# Patient Record
Sex: Male | Born: 1955 | Hispanic: Yes | Marital: Married | Smoking: Former smoker
Health system: Southern US, Community
[De-identification: ages and names within clinical notes are randomized; demographics above are authoritative.]

## PROBLEM LIST (undated history)

## (undated) DIAGNOSIS — E0590 Thyrotoxicosis, unspecified without thyrotoxic crisis or storm: Secondary | ICD-10-CM

## (undated) DIAGNOSIS — E785 Hyperlipidemia, unspecified: Secondary | ICD-10-CM

## (undated) DIAGNOSIS — D649 Anemia, unspecified: Secondary | ICD-10-CM

## (undated) DIAGNOSIS — T7840XA Allergy, unspecified, initial encounter: Secondary | ICD-10-CM

## (undated) DIAGNOSIS — R7611 Nonspecific reaction to tuberculin skin test without active tuberculosis: Secondary | ICD-10-CM

## (undated) DIAGNOSIS — C801 Malignant (primary) neoplasm, unspecified: Secondary | ICD-10-CM

## (undated) DIAGNOSIS — Z5189 Encounter for other specified aftercare: Secondary | ICD-10-CM

## (undated) DIAGNOSIS — J45909 Unspecified asthma, uncomplicated: Secondary | ICD-10-CM

## (undated) DIAGNOSIS — K219 Gastro-esophageal reflux disease without esophagitis: Secondary | ICD-10-CM

## (undated) DIAGNOSIS — J302 Other seasonal allergic rhinitis: Secondary | ICD-10-CM

## (undated) HISTORY — DX: Unspecified asthma, uncomplicated: J45.909

## (undated) HISTORY — PX: COLONOSCOPY W/ BIOPSIES AND POLYPECTOMY: SHX1376

## (undated) HISTORY — DX: Allergy, unspecified, initial encounter: T78.40XA

## (undated) HISTORY — PX: POLYPECTOMY: SHX149

## (undated) HISTORY — DX: Gastro-esophageal reflux disease without esophagitis: K21.9

## (undated) HISTORY — DX: Malignant (primary) neoplasm, unspecified: C80.1

## (undated) HISTORY — DX: Anemia, unspecified: D64.9

## (undated) HISTORY — DX: Hyperlipidemia, unspecified: E78.5

## (undated) HISTORY — DX: Encounter for other specified aftercare: Z51.89

## (undated) HISTORY — PX: COLONOSCOPY: SHX174

---
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
## 2008-03-04 HISTORY — PX: CHOLECYSTECTOMY: SHX55

## 2008-04-07 ENCOUNTER — Ambulatory Visit (HOSPITAL_BASED_OUTPATIENT_CLINIC_OR_DEPARTMENT_OTHER): Admission: RE | Admit: 2008-04-07 | Discharge: 2008-04-07 | Payer: Self-pay | Admitting: Family Medicine

## 2008-04-07 ENCOUNTER — Encounter (INDEPENDENT_AMBULATORY_CARE_PROVIDER_SITE_OTHER): Payer: Self-pay | Admitting: *Deleted

## 2008-04-07 ENCOUNTER — Ambulatory Visit: Payer: Self-pay | Admitting: Diagnostic Radiology

## 2008-05-04 ENCOUNTER — Encounter (INDEPENDENT_AMBULATORY_CARE_PROVIDER_SITE_OTHER): Payer: Self-pay | Admitting: *Deleted

## 2008-05-04 ENCOUNTER — Ambulatory Visit (HOSPITAL_COMMUNITY): Admission: RE | Admit: 2008-05-04 | Discharge: 2008-05-06 | Payer: Self-pay | Admitting: General Surgery

## 2008-05-04 ENCOUNTER — Encounter (INDEPENDENT_AMBULATORY_CARE_PROVIDER_SITE_OTHER): Payer: Self-pay | Admitting: General Surgery

## 2008-05-04 ENCOUNTER — Ambulatory Visit: Payer: Self-pay | Admitting: Internal Medicine

## 2008-05-05 ENCOUNTER — Encounter: Payer: Self-pay | Admitting: Internal Medicine

## 2008-05-09 ENCOUNTER — Ambulatory Visit: Payer: Self-pay | Admitting: Internal Medicine

## 2008-05-09 LAB — CONVERTED CEMR LAB
ALT: 74 units/L — ABNORMAL HIGH (ref 0–53)
Albumin: 3.9 g/dL (ref 3.5–5.2)
Bilirubin, Direct: 0.2 mg/dL (ref 0.0–0.3)
Total Protein: 7.4 g/dL (ref 6.0–8.3)

## 2008-05-10 ENCOUNTER — Telehealth: Payer: Self-pay | Admitting: Internal Medicine

## 2008-05-16 ENCOUNTER — Ambulatory Visit: Payer: Self-pay | Admitting: Internal Medicine

## 2008-05-16 DIAGNOSIS — Z862 Personal history of diseases of the blood and blood-forming organs and certain disorders involving the immune mechanism: Secondary | ICD-10-CM

## 2008-05-16 DIAGNOSIS — Z8639 Personal history of other endocrine, nutritional and metabolic disease: Secondary | ICD-10-CM

## 2008-05-16 DIAGNOSIS — K8020 Calculus of gallbladder without cholecystitis without obstruction: Secondary | ICD-10-CM | POA: Insufficient documentation

## 2008-05-16 LAB — CONVERTED CEMR LAB
Alkaline Phosphatase: 61 units/L (ref 39–117)
Bilirubin, Direct: 0.2 mg/dL (ref 0.0–0.3)
Total Bilirubin: 0.7 mg/dL (ref 0.3–1.2)
Total Protein: 6.5 g/dL (ref 6.0–8.3)

## 2008-12-15 ENCOUNTER — Ambulatory Visit: Payer: Self-pay | Admitting: Internal Medicine

## 2008-12-29 ENCOUNTER — Telehealth: Payer: Self-pay | Admitting: Internal Medicine

## 2008-12-30 ENCOUNTER — Encounter: Payer: Self-pay | Admitting: Internal Medicine

## 2008-12-30 ENCOUNTER — Ambulatory Visit: Payer: Self-pay | Admitting: Internal Medicine

## 2008-12-30 DIAGNOSIS — Z8711 Personal history of peptic ulcer disease: Secondary | ICD-10-CM | POA: Insufficient documentation

## 2009-01-02 ENCOUNTER — Encounter: Payer: Self-pay | Admitting: Internal Medicine

## 2009-01-03 ENCOUNTER — Ambulatory Visit: Payer: Self-pay | Admitting: Internal Medicine

## 2009-01-03 DIAGNOSIS — D126 Benign neoplasm of colon, unspecified: Secondary | ICD-10-CM | POA: Insufficient documentation

## 2009-11-15 ENCOUNTER — Encounter: Payer: Self-pay | Admitting: Internal Medicine

## 2009-11-23 ENCOUNTER — Encounter: Payer: Self-pay | Admitting: Internal Medicine

## 2010-03-02 IMAGING — US US ABDOMEN COMPLETE
1 series · 14 of 25 positions shown · non-contrast
Comparison: None

CLINICAL DATA: Abdominal pain.  Bloating.

ABDOMEN ULTRASOUND
TECHNIQUE: Complete abdominal ultrasound examination was performed
including evaluation of the liver, gallbladder, bile ducts,
pancreas, kidneys, spleen, IVC, and abdominal aorta.

[Series 1: us abdomen complete · 0.35mm/px · 14 of 86 slices shown]
[im 1/86]
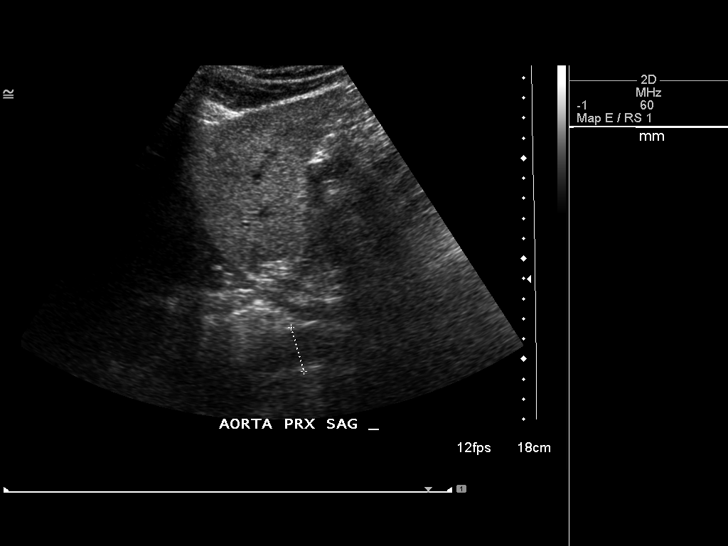
[im 8/86]
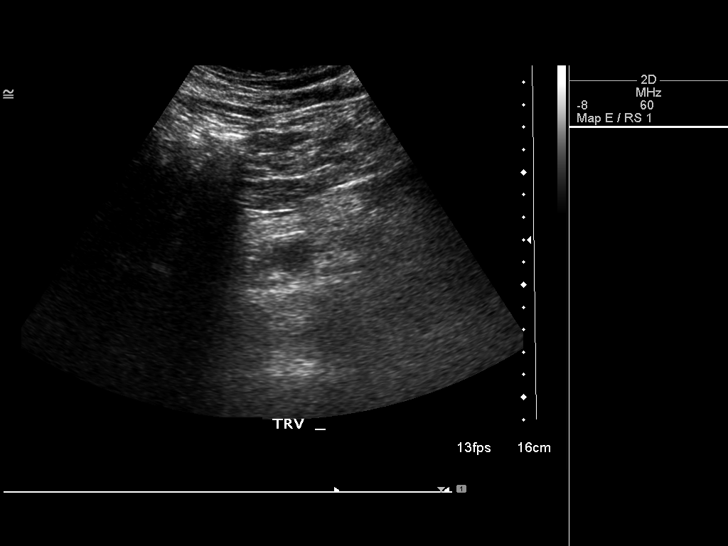
[im 15/86]
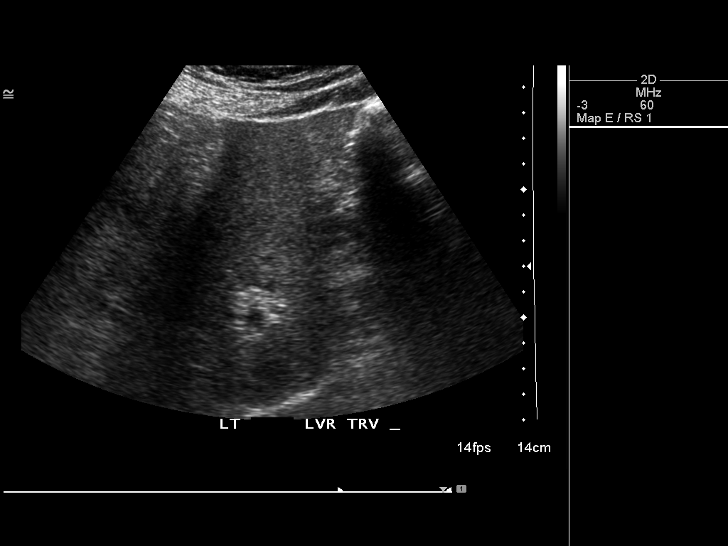
[im 22/86]
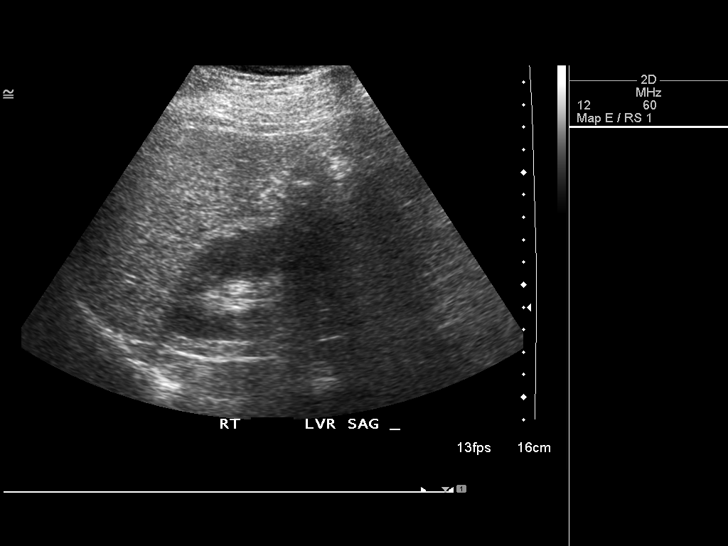
[im 29/86]
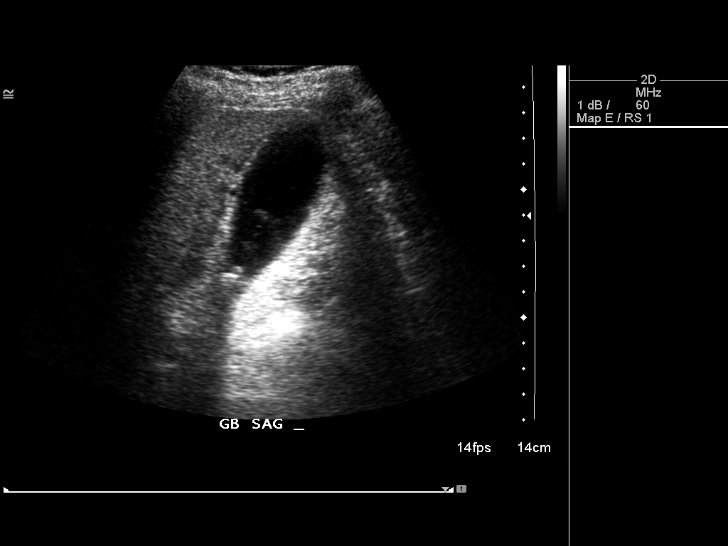
[im 32/86]
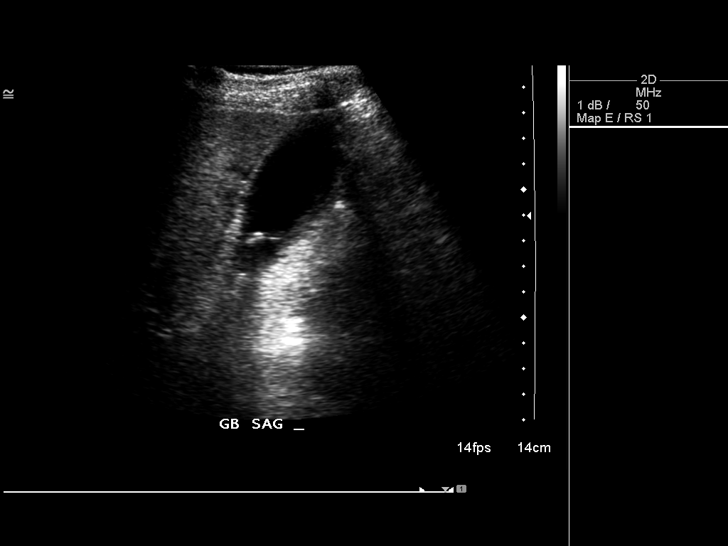
[im 39/86]
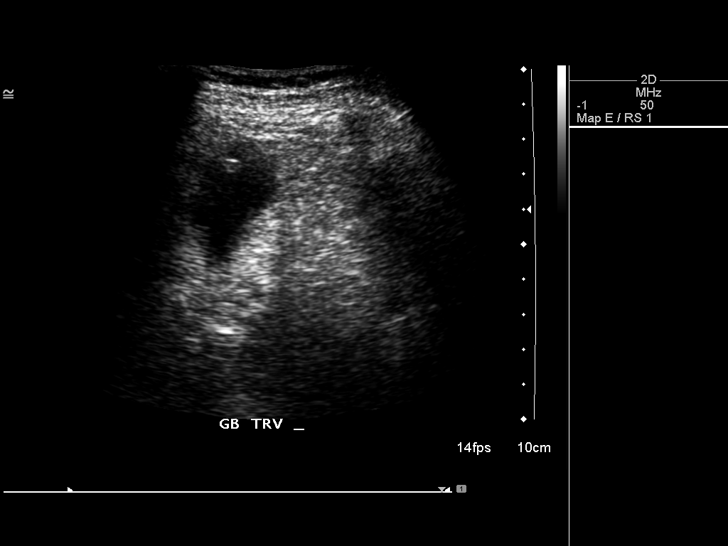
[im 47/86]
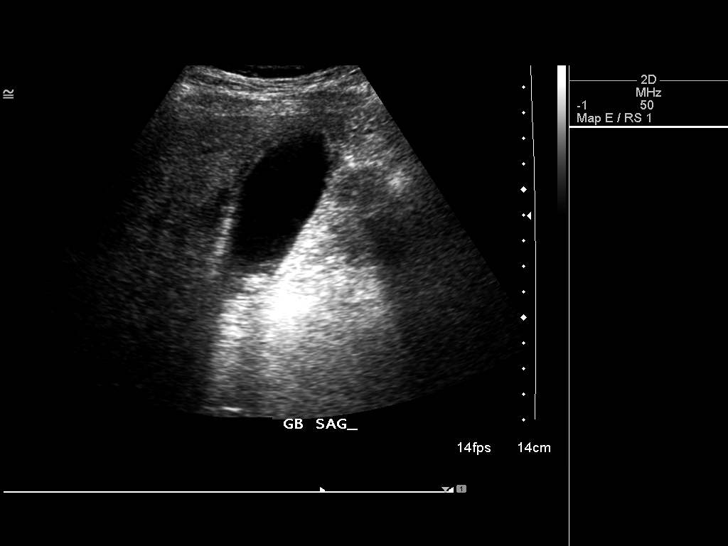
[im 54/86]
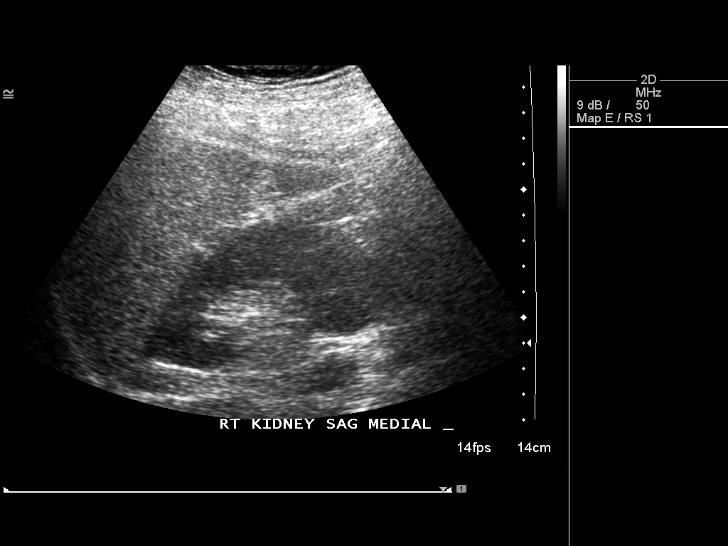
[im 57/86]
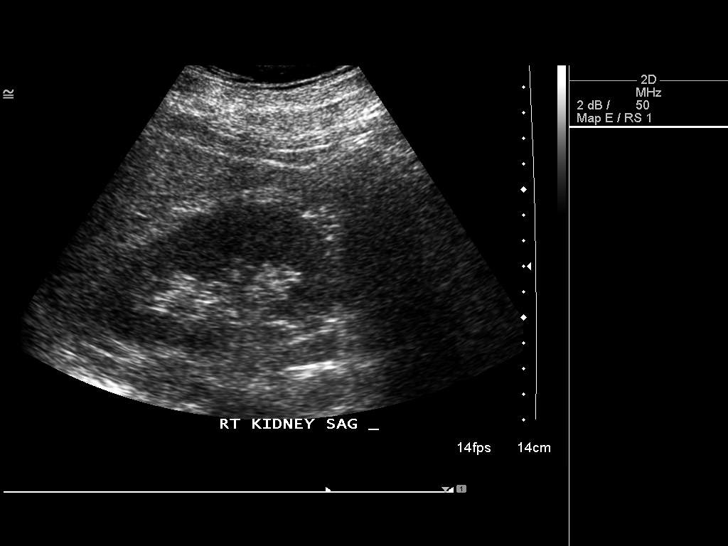
[im 64/86]
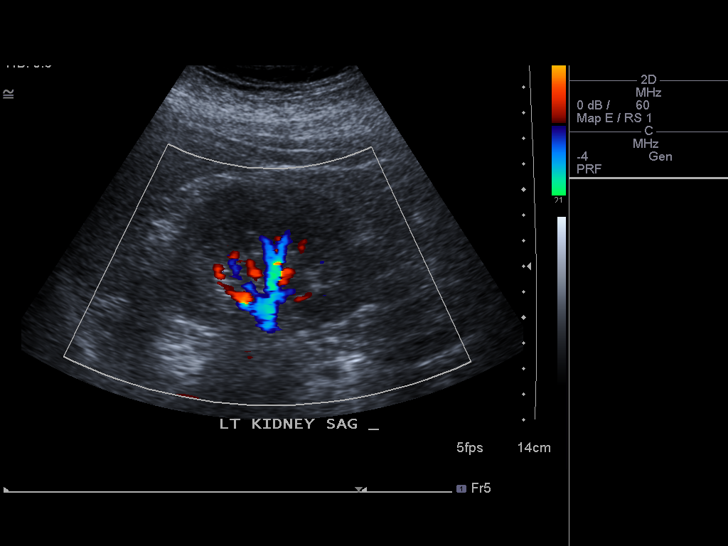
[im 71/86]
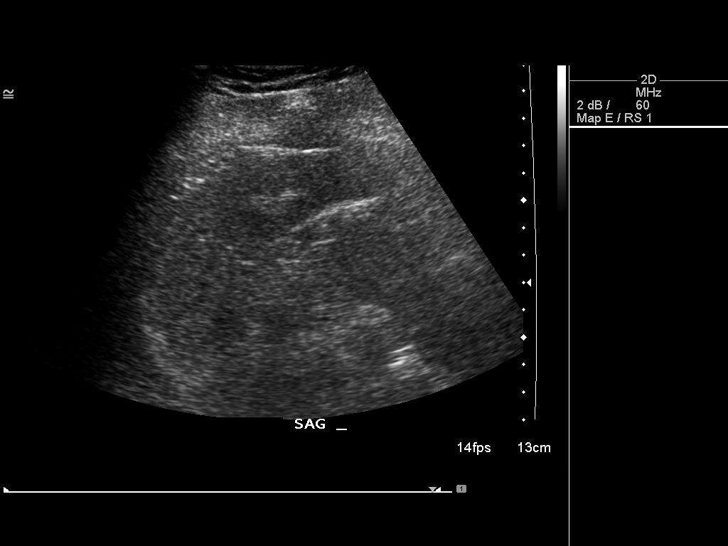
[im 78/86]
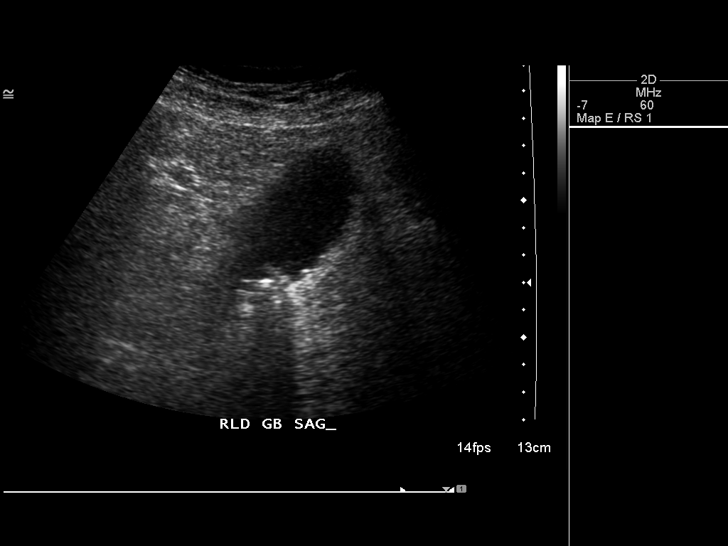
[im 86/86]
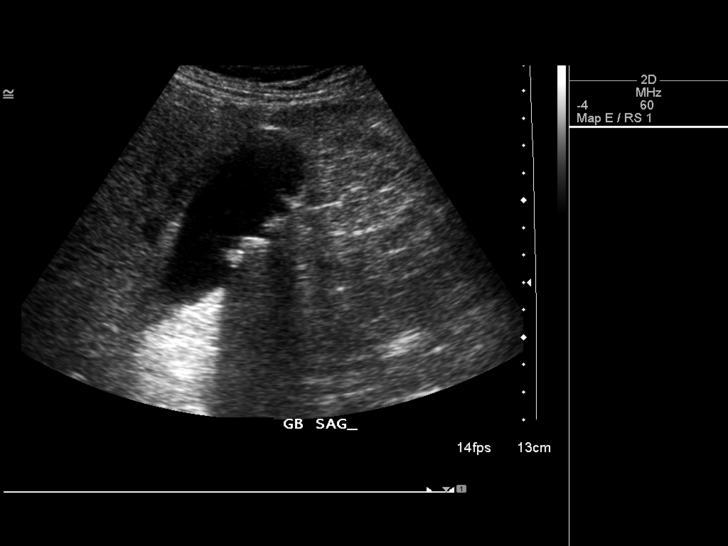

[14 of 25 positions shown; findings below may reference images not displayed]

FINDINGS: Multiple echogenic foci are identified within the
gallbladder.  No sonographic Murphy's sign is identified.  The
gallbladder wall is upper limits normal in thickness, measuring
mm.  No pericholecystic fluid is identified.  Limited evaluation of
the common bile duct shows normal caliber, 2.6 mm.

The liver is echogenic and coarse, consistent with fatty
infiltration.  There is limited evaluation of the inferior vena
cava and pancreas.  The spleen appears echogenic but is normal in
size.  No discrete lesions are identified.  The kidneys have normal
appearance.  Limited evaluation of the abdominal aorta shows no
aneurysm.
IMPRESSION: 1.  Multiple gallstones without other evidence for acute
cholecystitis.
2.  Somewhat limited evaluation because of overlying bowel gas.

REF:W2 DICTATED: 04/07/2008 [DATE]

## 2010-03-29 IMAGING — RF DG CHOLANGIOGRAM OPERATIVE
1 series · 12 of 12 positions shown · non-contrast
Comparison: Ultrasound 04/07/2008

CLINICAL DATA: Biliary colic.

INTRAOPERATIVE CHOLANGIOGRAM
TECHNIQUE: Multiple fluoroscopic spot radiographs were obtained
during intraoperative cholangiogram and are submitted for
interpretation post-operatively.
Fluoroscopy Time: 0.21 minutes.

[Series 1: run · 3 acquisitions, 12 frames shown]
[im 1/3]
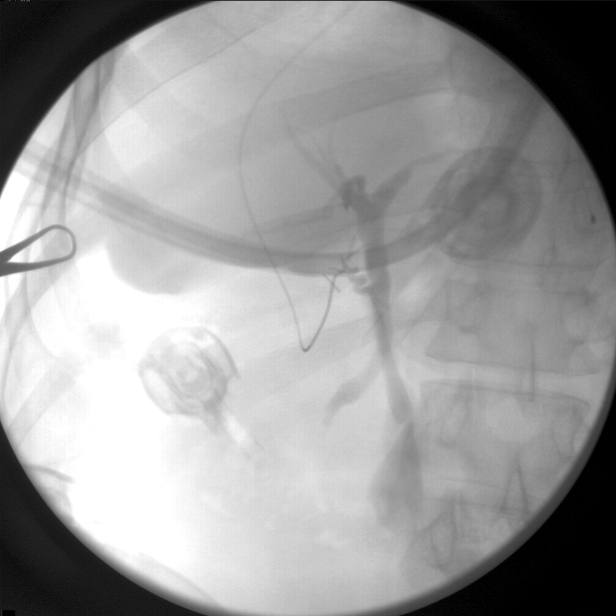
[im 1/3]
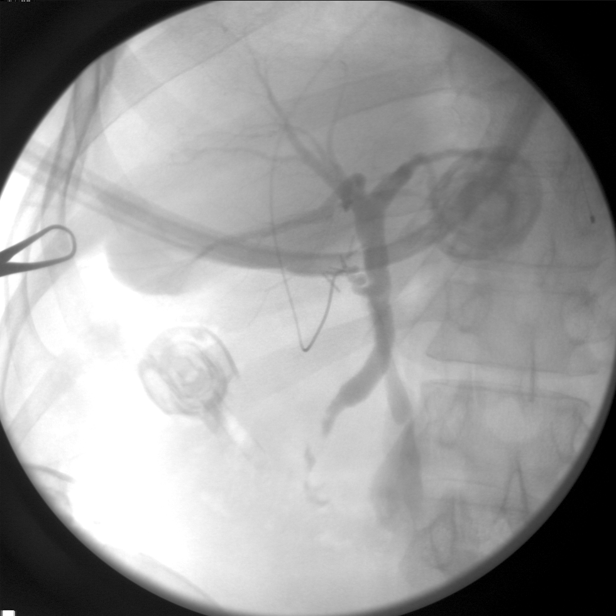
[im 1/3]
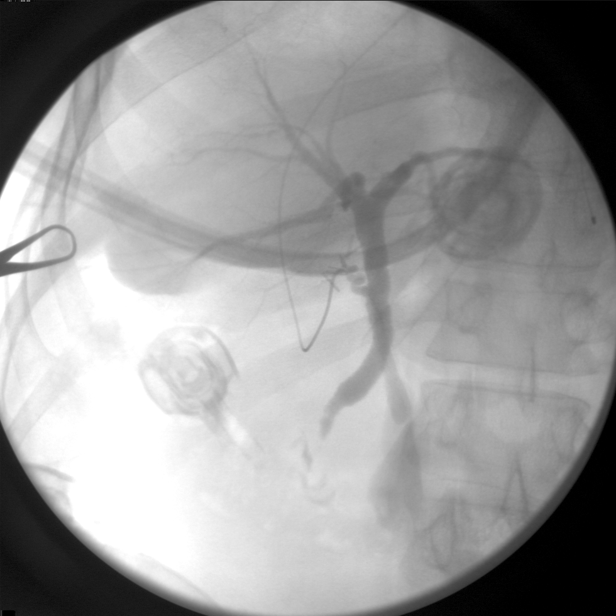
[im 1/3]
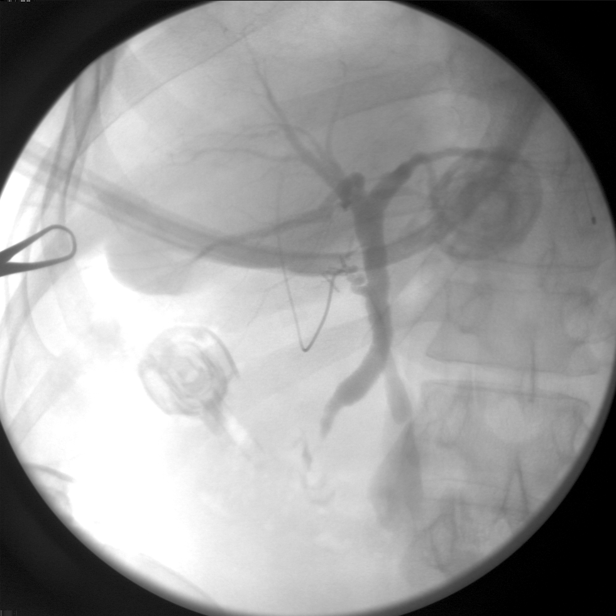
[im 2/3]
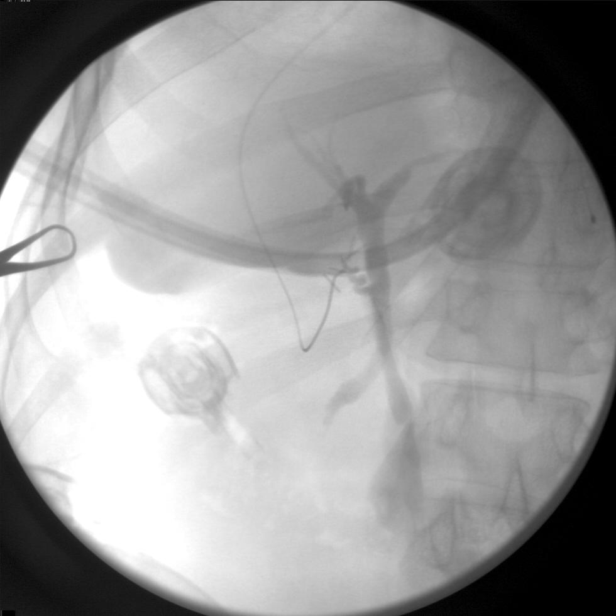
[im 2/3]
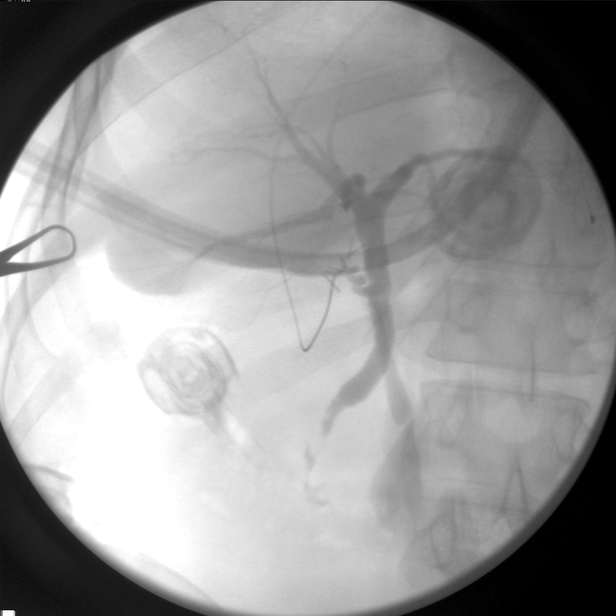
[im 2/3]
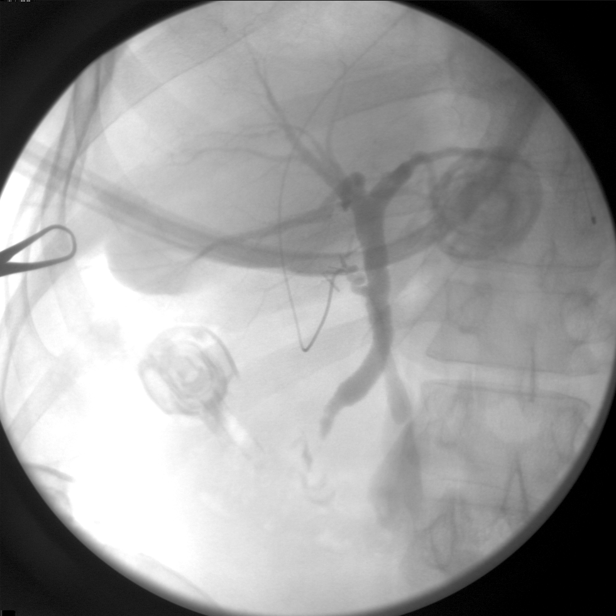
[im 2/3]
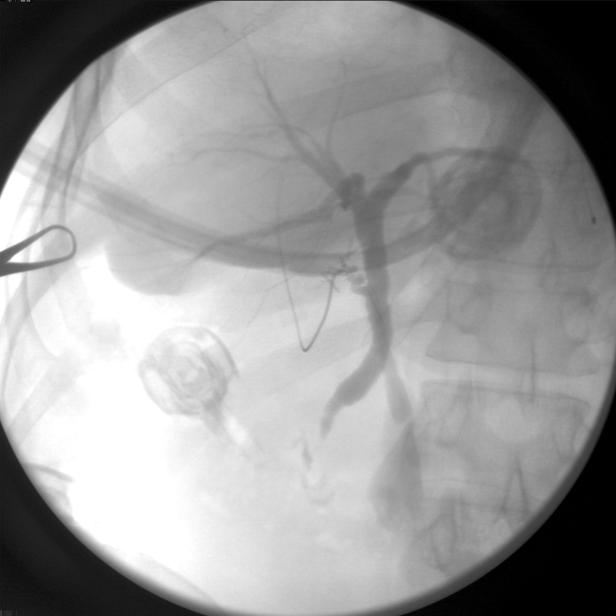
[im 3/3]
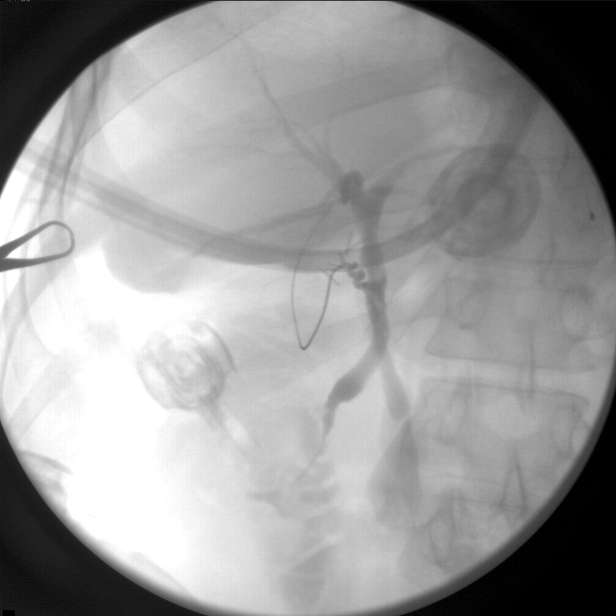
[im 3/3]
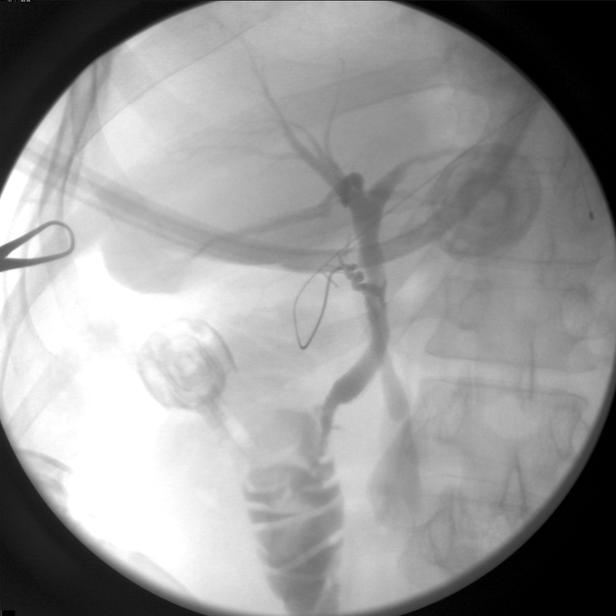
[im 3/3]
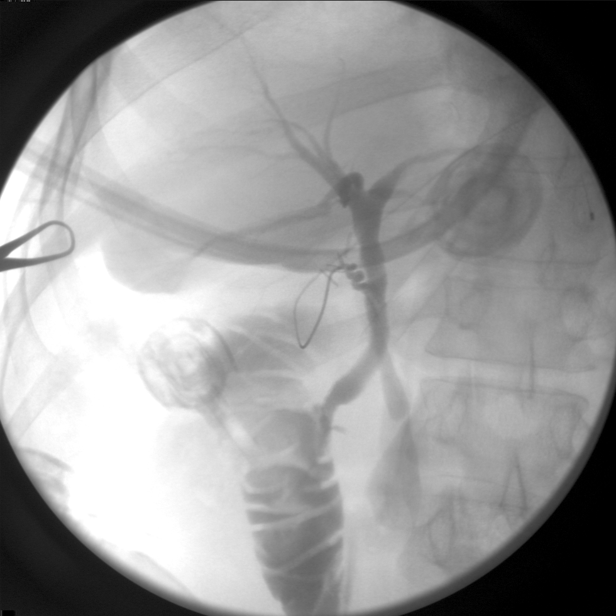
[im 3/3]
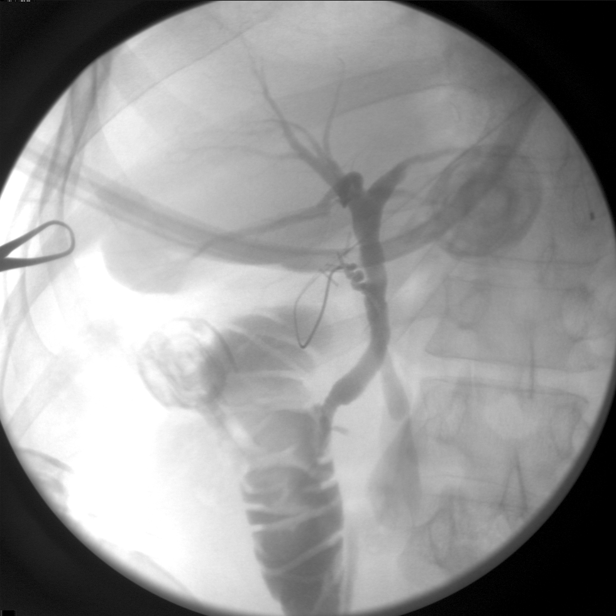

[12 of 12 positions shown; findings below may reference images not displayed]

FINDINGS: 3 cine loops of an intraoperative cholangiogram are
submitted.  Initial images demonstrate poor visualization and
apparent narrowing of the distal common bile duct near the
sphincter.  Final image shows better opacification of the distal
duct with passage of contrast in the small bowel.  Additional
appearance likely was related to spasm.  No retained filling
defects.
IMPRESSION: Final cine run demonstrates unremarkable CBD.  No evidence of
retained stone or obstruction.

## 2010-04-05 NOTE — Procedures (Signed)
Summary: Recall Assessment/[facility]  Recall Assessment/[facility]   Imported By: Sherian Rein 11/27/2009 07:34:25  _____________________________________________________________________  External Attachment:    Type:   Image     Comment:   External Document

## 2010-04-05 NOTE — Letter (Signed)
Summary: Colonoscopy Letter  [facility]  [address] [place], [place] [address]   Phone: [phone]  Fax: [phone]      November 23, 2009 MRN: [id number]   PADEN SENGER [address] [place], [place]  [address]   Dear Mr. ROGERSON,   According to your medical record, it is time for you to schedule a Colonoscopy. The American Cancer Society recommends this procedure as a method to detect early colon cancer. Patients with a family history of colon cancer, or a personal history of colon polyps or inflammatory bowel disease are at increased risk.  This letter has beeen generated based on the recommendations made at the time of your procedure. If you feel that in your particular situation this may no longer apply, please contact our office.  Please call our office at [phone] to schedule this appointment or to update your records at your earliest convenience.  Thank you for cooperating with [place] to provide you with the very best care possible.   Sincerely,  Hedwig Morton. Juanda Chance, M.D.  [facility] [phone]

## 2010-06-14 LAB — HEMOGLOBIN AND HEMATOCRIT, BLOOD
HCT: 37.3 % — ABNORMAL LOW (ref 39.0–52.0)
Hemoglobin: 12.6 g/dL — ABNORMAL LOW (ref 13.0–17.0)

## 2010-06-14 LAB — HEPATIC FUNCTION PANEL
ALT: 95 U/L — ABNORMAL HIGH (ref 0–53)
AST: 147 U/L — ABNORMAL HIGH (ref 0–37)
AST: 92 U/L — ABNORMAL HIGH (ref 0–37)
Bilirubin, Direct: 0.3 mg/dL (ref 0.0–0.3)
Bilirubin, Direct: 0.3 mg/dL (ref 0.0–0.3)
Indirect Bilirubin: 0.9 mg/dL (ref 0.3–0.9)
Indirect Bilirubin: 1 mg/dL — ABNORMAL HIGH (ref 0.3–0.9)
Total Bilirubin: 1.2 mg/dL (ref 0.3–1.2)
Total Bilirubin: 1.3 mg/dL — ABNORMAL HIGH (ref 0.3–1.2)

## 2010-06-14 LAB — PROTIME-INR
INR: 1.1 (ref 0.00–1.49)
Prothrombin Time: 14.7 seconds (ref 11.6–15.2)

## 2010-06-14 LAB — COMPREHENSIVE METABOLIC PANEL
ALT: 51 U/L (ref 0–53)
Alkaline Phosphatase: 80 U/L (ref 39–117)
BUN: 10 mg/dL (ref 6–23)
CO2: 26 mEq/L (ref 19–32)
Chloride: 106 mEq/L (ref 96–112)
Glucose, Bld: 145 mg/dL — ABNORMAL HIGH (ref 70–99)
Potassium: 3.5 mEq/L (ref 3.5–5.1)
Sodium: 139 mEq/L (ref 135–145)
Total Bilirubin: 1 mg/dL (ref 0.3–1.2)
Total Protein: 6.7 g/dL (ref 6.0–8.3)

## 2010-07-17 NOTE — Discharge Summary (Signed)
NAME:  MYRL, LAZARUS                  ACCOUNT NO.:  [id number]   MEDICAL RECORD NO.:  [id number]          PATIENT TYPE:  OIB   LOCATION:  1533                         FACILITY:  [facility]   PHYSICIAN:  Lennie Muckle, MD      DATE OF BIRTH:  [birth year]   DATE OF ADMISSION:  05/04/2008  DATE OF DISCHARGE:  05/06/2008                               DISCHARGE SUMMARY   FINAL DIAGNOSIS:  Biliary colic, possible choledocholithiasis.   PROCEDURE:  1. On May 04, 2008, laparoscopic cholecystectomy with cholangiogram.  2. On May 05, 2008, attempted ERCP.   HOSPITAL COURSE:  Mr. Zou is a 55 year old male who was admitted  following laparoscopic cholecystectomy with cholangiogram.  On the  cholangiogram, I had seen a lucency at the distal common bile duct which  was nonobstructing.  Due to this area, I had called Dr. Juanda Chance for a  consult for evaluation of her possible ERCP.  The radiology read on the  cholangiogram did not reveal any evidence of a stone; however, I felt  that I did see a lucency during the live cholangiogram.  He also had an  elevation of his liver enzymes postoperatively with the bilirubin rising  to 1.3, AST and ALT were in the low 100's.  Therefore, after discussion  with the patient and Dr. Edwyna Shell, who had gone for attempted ERCP, he had  some folding and edema near the ampulla, and ERCP was unable to be  performed.   He has had a trend-down in his liver enzymes today, low 90's with a  bilirubin normal at 1.2.  He had some mild gaseous discomfort in his  abdomen today.  Incisions are without evidence of infection.  He has  been afebrile and otherwise doing well.  I will discharge him home with  either Percocet or Vicodin for pain.  He already has a prescription for  Vicodin.  I instructed him to take a stool softener and fiber daily.  Ambulate as much as possible to decrease the gaseous discomfort.  He  will follow up with Dr. Juanda Chance on Monday for repeat labs and call me  if  he develops any abdominal pain, fevers, chills, or general questions.  He can shower daily, apply Vaseline to his incisions after 10 days, rub  the Vaseline gently.      Lennie Muckle, MD  Electronically Signed     ALA/MEDQ  D:  05/06/2008  T:  05/06/2008  Job:  [id number]   cc:   Hedwig Morton. Juanda Chance, MD  [address]  [place]  [place] [address]   Dr. Pablo Ledger

## 2010-07-17 NOTE — Letter (Signed)
May 16, 2008    Lennie Muckle, M.D.   [facility]   RE:  Robert Hoover, Robert Hoover  MRN:  [id number]  /  DOB:  [birth year]   Dear Joice Lofts:   This is just to let you know that Robert Hoover had 2 sets of liver function  tests following his unsuccessful ERCP on May 05, 2008.  He was found to  have rather edematous papillae which I could not cannulate.  His liver  function tests last week were slightly abnormal but improved.  Today's  liver function tests on May 16, 2008 are completely normal, including  transaminases as well as his bilirubin.  I have spoken to him personally  and he is feeling well and has been back to work for the past 4 days.  He has an appointment with you tomorrow.  At this point, I will not  repeat the ERCP because the yield of finding stones would be very low.  He is doing well, and I have asked him to see me on a p.r.n. basis.  If  he develops any symptoms consistent with biliary colic, I will go ahaed  with an ERCPunder propofol or in OR and  have one of my associates to do  that.  Thank you so much for letting me share in his care.   Best regards,    Sincerely,      Hedwig Morton. Juanda Chance, MD  Electronically Signed    DMB/MedQ  DD: 05/16/2008  DT: 05/16/2008  Job #: [id number]

## 2010-07-17 NOTE — Op Note (Signed)
NAME:  SWAIN, ACREE NO.:  [id number]   MEDICAL RECORD NO.:  [id number]          PATIENT TYPE:  OIB   LOCATION:  1533                         FACILITY:  [facility]   PHYSICIAN:  Lennie Muckle, MD      DATE OF BIRTH:  [birth year]   DATE OF PROCEDURE:  05/04/2008  DATE OF DISCHARGE:                               OPERATIVE REPORT   PREOPERATIVE DIAGNOSIS:  Symptomatic cholelithiasis.   POSTOPERATIVE DIAGNOSES:  1. Symptomatic cholelithiasis.  2. Nonobstructing choledocholithiasis.   PROCEDURE:  Laparoscopic cholecystectomy.   SURGEON:  Amber L. Freida Busman, M.D.   ASSISTANT:  None.   General endotracheal anesthesia.   FINDINGS:  Thickened gallbladder wall.  Stones within the gallbladder.  Nonobstructing stone in the common bile duct.   SPECIMEN:  Gallbladder.   BLOOD LOSS:  50 mL.   No immediate complications.  No drains were placed.   INDICATIONS FOR PROCEDURE:  Mr. Kervin is a 55 year old male who had had  multiple episodes of epigastric right upper quadrant pain.  Ultrasound  revealed stones within the gallbladder.  His preoperative liver enzymes  were normal.  I discussed with the patient performing a laparoscopic  cholecystectomy and cholangiogram.   DETAILS OF THE PROCEDURE:  Mr. Ramiro was identified in the preoperative  holding area.  He was given 2 g of cefoxitin and was taken to the  operating room.  Once in the operating room, he was placed in a supine  position.  After administration of general endotracheal anesthesia, the  abdomen was prepped and draped in the usual sterile fashion.  A time-out  procedure indicating patient and procedure was performed.  I placed  incision at the umbilicus with the #11 blade, grasped the umbilicus with  towel clamp, placed the Veress needle into the abdominal cavity.  After  adequate insufflation, I placed a 5-mm trocar using the Optiview into  the abdominal cavity.  All layers of the abdominal wall were visualized  upon entry.  I inspected the abdomen.  There was no evidence of injury  following placement of the trocar or the Veress needle.  The patient was  elevated, reverse Trendelenburg, right side up.  I placed an 11-mm  trocar in the epigastric region under visualization with the camera.  Two 5-mm trocars were also placed in the same fashion in the right side  of the abdomen.  The gallbladder was noted be distended.  The omental  adhesions were taken down with sharp dissection, grasped the fundus and  retracted this to the head of the patient.  The infundibulum was grasped  away from the liver bed.  The gallbladder wall was rather thickened in  nature.  Using careful dissection, using the [place] forceps, hook  electrocautery and the suction irrigator, I was able to ligate the  cystic artery which was anterior to the cystic duct.  I continued  dissecting and obtained a critical view of the cystic duct and liver  bed.  I placed a clip proximally on the cystic duct and cholangiogram  was performed.  Flow at first did not  go into the duodenum.  However, I  was able to visualize flow into the duodenum after flushing.  There  appeared to be a small stone within the common duct.  The patient was  given 1 g of glucagon.  Again, there appeared to be a luceny in the  distal duct.  This was not obstructing the common duct.  Flow did go  easily into the duodenum.  I then removed the cholangiogram catheter,  placed 2 clips proximally, and transected the cystic duct.  An Endoloop  was also placed on the cystic duct.  I then continued dissecting the  peritoneal attachments of the gallbladder from the liver bed using the  low flow hook electrocautery.  A small amount of bile was spilled due to  the difficulty of the plane.  Specimen was placed in an EndoCatch bag  and removed from the abdomen at the epigastric region.  I then irrigated  the abdomen with 3 liters of saline.  A small amount of oozing from the   liver bed was controlled with electrocautery.  Surgicel was also placed  in the liver bed.  Final inspection did not reveal any bleeding and no  bile leakage.  I closed the fascia at the epigastric region using a 0  Vicryl suture.  Pneumoperitoneum was released.  I removed the trocars  and the skin was closed with 4-0 Monocryl.  The patient was extubated,  transferred to the postanesthesia care unit in stable condition.  He  will be monitored overnight.  I will call gastroenterology for opinion  on the nonobstructing choledocholithiasis.      Lennie Muckle, MD  Electronically Signed     ALA/MEDQ  D:  05/04/2008  T:  05/04/2008  Job:  [id number]   cc:   Bernadette Hoit, M.D.

## 2012-07-23 ENCOUNTER — Encounter (INDEPENDENT_AMBULATORY_CARE_PROVIDER_SITE_OTHER): Payer: Self-pay | Admitting: Surgery

## 2012-07-23 ENCOUNTER — Ambulatory Visit (INDEPENDENT_AMBULATORY_CARE_PROVIDER_SITE_OTHER): Payer: BC Managed Care – PPO | Admitting: Surgery

## 2012-07-23 VITALS — BP 124/70 | HR 78 | Temp 98.6°F | Resp 17 | Ht 63.0 in | Wt 165.6 lb

## 2012-07-23 DIAGNOSIS — D371 Neoplasm of uncertain behavior of stomach: Secondary | ICD-10-CM

## 2012-07-23 DIAGNOSIS — D374 Neoplasm of uncertain behavior of colon: Secondary | ICD-10-CM

## 2012-07-23 NOTE — Progress Notes (Signed)
Patient ID: Robert Hoover, male   DOB: 07/14/1955, 57 y.o.   MRN: [id number]  No chief complaint on file.   HPI Robert Hoover is a 57 y.o. male.  PCP - Dr. Rafaela Aguiar.  Referred by Dr. Rhoton, [facility] for evaluation of bleeding right colon mass HPI This is a 57-year-old Hispanic male who had previously undergone a colonoscopy in 2010 by Dr. Dora Brodie which showed a tubulovillous adenoma of the right colon. Surgical consult was recommended at that time but the patient declined. Recently he had a routine physical examination. At that time he was complaining of shortness of breath and fatigue. He was found to be profoundly anemic with a hemoglobin of 7.9. He was then referred to Dr.Rhoton for evaluation. He underwent EGD and colonoscopy on 07/10/12. The EGD was unremarkable. The colonoscopy showed a large mass lesion in the a sending colon which was tattooed with [place] and. This mass is large and fungating and fairly friable. It involves 3/4 of the circumference of the colon measuring about 5 cm in length. Biopsies were performed which showed a villous adenoma with focal high-grade dysplasia. 2 other smaller  Polyps were biopsied and showed only tubular adenomas. He is now referred for surgical evaluation for resection. It is unclear to me whether the patient is on any iron supplements or has had a blood transfusions.  Over the last couple of days the patient has been having a lot of urinary symptoms as well as fevers and low pelvic pain. He was diagnosed with prostatitis and is currently undergoing treatment with ciprofloxacin. He still does not feel well.  Past Medical History  Diagnosis Date  . Asthma   . Hyperlipidemia   . Thyroid disease   . Anemia     Past Surgical History  Procedure Laterality Date  . Cholecystectomy    Lap chole 2010 Dr. Allen  Family History  Problem Relation Age of Onset  . Kidney cancer Father     Social History History  Substance Use Topics  . Smoking  status: Former Smoker    Start date: 07/24/1982  . Smokeless tobacco: Not on file  . Alcohol Use: No    No Known Allergies  Current Outpatient Prescriptions  Medication Sig Dispense Refill  . acetaminophen (TYLENOL) 100 MG/ML solution Take 10 mg/kg by mouth every 4 (four) hours as needed for fever.      . ciprofloxacin (CIPRO) 500 MG/5ML (10%) suspension Take by mouth 2 (two) times daily.      . levothyroxine (SYNTHROID, LEVOTHROID) 100 MCG tablet Take 100 mcg by mouth daily before breakfast.       No current facility-administered medications for this visit.    Review of Systems Review of Systems  Constitutional: Positive for fever, chills and activity change. Negative for unexpected weight change.  HENT: Negative for hearing loss, congestion, sore throat, trouble swallowing and voice change.   Eyes: Negative for visual disturbance.  Respiratory: Negative for cough and wheezing.   Cardiovascular: Negative for chest pain, palpitations and leg swelling.  Gastrointestinal: Positive for blood in stool. Negative for nausea, vomiting, abdominal pain, diarrhea, constipation, abdominal distention, anal bleeding and rectal pain.  Genitourinary: Positive for urgency and difficulty urinating. Negative for hematuria.  Musculoskeletal: Negative for arthralgias.  Skin: Negative for rash and wound.  Neurological: Negative for seizures, syncope, weakness and headaches.  Hematological: Negative for adenopathy. Does not bruise/bleed easily.  Psychiatric/Behavioral: Negative for confusion.    Blood pressure 124/70, pulse 78,   temperature 98.6 F (37 C), resp. rate 17, height 5' 3" (1.6 m), weight 165 lb 9.6 oz (75.116 kg).  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  The surgical procedure has been discussed with the patient.  Potential risks, benefits, alternative treatments, and expected outcomes have been explained.  All of the patient's questions at this time have been answered.  The likelihood of reaching the patient's treatment goal is good.  The patient understand the proposed surgical procedure and wishes to proceed. The patient understands the possibility of finding invasive cancer in the polyp and the need for possible further surgery.          Gwenith Tschida K. 07/23/2012, 5:17 PM    

## 2012-07-31 ENCOUNTER — Encounter (HOSPITAL_COMMUNITY): Payer: Self-pay | Admitting: Pharmacy Technician

## 2012-08-05 ENCOUNTER — Encounter (HOSPITAL_COMMUNITY)
Admission: RE | Admit: 2012-08-05 | Discharge: 2012-08-05 | Disposition: A | Discharge status: Home / Self Care | Payer: BC Managed Care – PPO | Source: Ambulatory Visit | Attending: Anesthesiology | Admitting: Anesthesiology

## 2012-08-05 ENCOUNTER — Encounter (HOSPITAL_COMMUNITY): Payer: Self-pay

## 2012-08-05 ENCOUNTER — Telehealth (INDEPENDENT_AMBULATORY_CARE_PROVIDER_SITE_OTHER): Payer: Self-pay | Admitting: *Deleted

## 2012-08-05 ENCOUNTER — Encounter (HOSPITAL_COMMUNITY)
Admission: RE | Admit: 2012-08-05 | Discharge: 2012-08-05 | Disposition: A | Discharge status: Home / Self Care | Payer: BC Managed Care – PPO | Source: Ambulatory Visit | Attending: Surgery | Admitting: Surgery

## 2012-08-05 DIAGNOSIS — Z01818 Encounter for other preprocedural examination: Secondary | ICD-10-CM | POA: Insufficient documentation

## 2012-08-05 DIAGNOSIS — Z01812 Encounter for preprocedural laboratory examination: Secondary | ICD-10-CM | POA: Insufficient documentation

## 2012-08-05 HISTORY — DX: Other seasonal allergic rhinitis: J30.2

## 2012-08-05 HISTORY — DX: Thyrotoxicosis, unspecified without thyrotoxic crisis or storm: E05.90

## 2012-08-05 LAB — COMPREHENSIVE METABOLIC PANEL
ALT: 20 U/L (ref 0–53)
AST: 20 U/L (ref 0–37)
Albumin: 3.5 g/dL (ref 3.5–5.2)
Alkaline Phosphatase: 54 U/L (ref 39–117)
CO2: 28 mEq/L (ref 19–32)
Chloride: 104 mEq/L (ref 96–112)
Creatinine, Ser: 0.96 mg/dL (ref 0.50–1.35)
GFR calc non Af Amer: >90 mL/min (ref >90)
Potassium: 4.1 mEq/L (ref 3.5–5.1)
Sodium: 138 mEq/L (ref 135–145)
Total Bilirubin: 0.6 mg/dL (ref 0.3–1.2)

## 2012-08-05 LAB — CBC WITH DIFFERENTIAL/PLATELET
Basophils Absolute: 0 10*3/uL (ref 0.0–0.1)
Eosinophils Relative: 8 % — ABNORMAL HIGH (ref 0–5)
HCT: 25.5 % — ABNORMAL LOW (ref 39.0–52.0)
Hemoglobin: 7 g/dL — ABNORMAL LOW (ref 13.0–17.0)
Lymphocytes Relative: 32 % (ref 12–46)
Lymphs Abs: 1.4 10*3/uL (ref 0.7–4.0)
MCH: 17 pg — ABNORMAL LOW (ref 26.0–34.0)
MCHC: 27.5 g/dL — ABNORMAL LOW (ref 30.0–36.0)
Monocytes Relative: 9 % (ref 3–12)
Platelets: 558 10*3/uL — ABNORMAL HIGH (ref 150–400)

## 2012-08-05 LAB — ABO/RH: ABO/RH(D): A POS

## 2012-08-05 NOTE — Progress Notes (Addendum)
Chart put in Allison's bin to review labs;pt hemoglobin  7.0 and HCT 25.5. Spoke with Baxter Hire at Dr. Fatima Sanger office; nurse to make surgeon aware.

## 2012-08-05 NOTE — Progress Notes (Signed)
Pt denies SOB,chest pain and being under the care of a cardiologist. Pt states that he had an EKG done at [facility] by Dr. Bernadette Hoit (PCP) on 07/01/2012; results were requested along with latest office notes. Pt advised to bring rescue inhaler on DOS and to call Dr. Corliss Skains to see if he needs a bowel prep for surgery.

## 2012-08-05 NOTE — Telephone Encounter (Signed)
Ester with pre-admit called to let us know about the CBC results today.  Spoke to [place] [employer] MD and it was found that patient has chronic anemia and felt like this report can be sent to Tsuei MD to review tomorrow.

## 2012-08-05 NOTE — Pre-Procedure Instructions (Signed)
Robert Hoover  08/05/2012   Your procedure is scheduled on:  Tuesday, August 11, 2012  Report to [facility] at 10:00 AM.  Call this number if you have problems the morning of surgery: [phone]   Remember:   Do not eat food or drink liquids after midnight.   Take these medicines the morning of surgery with A SIP OF WATER: ciprofloxacin (CIPRO) 500 MG tablet, levothyroxine (SYNTHROID, LEVOTHROID) 100 MCG tablet             Stop taking Aspirin, and herbal medications. Do not take any NSAIDs ie: Ibuprofen, Advil, Naproxen or any medication containing Aspirin.             Do not wear jewelry, make-up or nail polish.  Do not wear lotions, powders, or perfumes. You may wear deodorant.  Do not shave 48 hours prior to surgery. Men may shave face and neck.  Do not bring valuables to the hospital.  [facility] is not responsible for any belongings or valuables.  Contacts, dentures or bridgework may not be worn into surgery.  Leave suitcase in the car. After surgery it may be brought to your room.  For patients admitted to the hospital, checkout time is 11:00 AM the day of discharge.   Patients discharged the day of surgery will not be allowed to drive home.  Name and phone number of your driver:   Special Instructions: Shower using CHG 2 nights before surgery and the night before surgery.  If you shower the day of surgery use CHG.  Use special wash - you have one bottle of CHG for all showers.  You should use approximately 1/3 of the bottle for each shower.   Please read over the following fact sheets that you were given: Pain Booklet, Coughing and Deep Breathing, Blood Transfusion Information and Surgical Site Infection Prevention

## 2012-08-06 ENCOUNTER — Telehealth (INDEPENDENT_AMBULATORY_CARE_PROVIDER_SITE_OTHER): Payer: Self-pay | Admitting: General Surgery

## 2012-08-06 ENCOUNTER — Other Ambulatory Visit (INDEPENDENT_AMBULATORY_CARE_PROVIDER_SITE_OTHER): Payer: Self-pay | Admitting: Surgery

## 2012-08-06 LAB — PREPARE RBC (CROSSMATCH)

## 2012-08-06 NOTE — Progress Notes (Signed)
Spoke with Dr. Harlon Flor who stated he just needed for patient to come in early on Dos to get in at least 1 unit blood prior to surgery.  Attempted to call patient , no answer.

## 2012-08-06 NOTE — Telephone Encounter (Signed)
Tsuei MD aware and has made arrangements for the patient per Pattricia Boss MA note on 08/06/12

## 2012-08-06 NOTE — Progress Notes (Signed)
Anesthesia chart: Patient is a 57 year old male scheduled for laparoscopic assisted right hemicolectomy on 08/11/2012 by Dr. Corliss Skains.  Recent biopsy of large right colon mass showed villous adenoma with high-grade dysplasia.  He is positive for bleeding from the mass.  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  GI is Dr. Loman Chroman in [facility].  PCP is Dr. Bernadette Hoit who is aware of plans for surgery.  EKG from [facility] on 07/01/12 showed NSR.    CXR on 08/05/12 showed no acute cardiopulmonary abnormalities.    Preoperative labs noted.  H/H 7.0/25.5.  Dr. Corliss Skains is aware and plans to transfuse patient with 2 units of PRBC on the morning of surgery.  Our short stay nurses will be contacting Mr. Tandy to instruct him to arrive early on the day of surgery to allow for time to initiate transfusion. (T&C has already been done.)  [facility]/Anesthesiology Phone [phone] 08/06/2012 12:34 PM

## 2012-08-06 NOTE — Progress Notes (Signed)
Patient instructed to arrive at 700 am on dos.

## 2012-08-06 NOTE — Telephone Encounter (Signed)
Called patient to let him know that he will be getting transfused 2 units of blood before surgery on June 10. Patient wanted to know his result of his lab and I went over the lab results with him. I called and talked to Boneta Lucks at Short Stay and told her that Dr Corliss Skains had put the orders in Epic for the 2 units for blood transfusion. Boneta Lucks call back number is [phone]

## 2012-08-10 ENCOUNTER — Encounter (INDEPENDENT_AMBULATORY_CARE_PROVIDER_SITE_OTHER): Payer: Self-pay

## 2012-08-10 MED ORDER — SODIUM CHLORIDE 0.9 % IV SOLN
1.0000 g | INTRAVENOUS | Status: AC
  Administered 2012-08-11: 1 g via INTRAVENOUS
  Filled 2012-08-10: qty 1

## 2012-08-11 ENCOUNTER — Encounter (HOSPITAL_COMMUNITY): Payer: Self-pay | Admitting: Vascular Surgery

## 2012-08-11 ENCOUNTER — Encounter (HOSPITAL_COMMUNITY)
Admission: RE | Disposition: A | Discharge status: Home / Self Care | Payer: Self-pay | Source: Ambulatory Visit | Attending: Surgery

## 2012-08-11 ENCOUNTER — Inpatient Hospital Stay (HOSPITAL_COMMUNITY): Payer: BC Managed Care – PPO | Admitting: Anesthesiology

## 2012-08-11 ENCOUNTER — Encounter (HOSPITAL_COMMUNITY): Payer: Self-pay | Admitting: Anesthesiology

## 2012-08-11 ENCOUNTER — Inpatient Hospital Stay (HOSPITAL_COMMUNITY)
Admission: RE | Admit: 2012-08-11 | Discharge: 2012-08-16 | DRG: 149 | Disposition: A | Discharge status: Home / Self Care | Payer: BC Managed Care – PPO | Source: Ambulatory Visit | Attending: Surgery | Admitting: Surgery

## 2012-08-11 DIAGNOSIS — C189 Malignant neoplasm of colon, unspecified: Secondary | ICD-10-CM

## 2012-08-11 DIAGNOSIS — D649 Anemia, unspecified: Secondary | ICD-10-CM | POA: Diagnosis present

## 2012-08-11 DIAGNOSIS — Z8051 Family history of malignant neoplasm of kidney: Secondary | ICD-10-CM

## 2012-08-11 DIAGNOSIS — J45909 Unspecified asthma, uncomplicated: Secondary | ICD-10-CM | POA: Diagnosis present

## 2012-08-11 DIAGNOSIS — C180 Malignant neoplasm of cecum: Principal | ICD-10-CM | POA: Diagnosis present

## 2012-08-11 DIAGNOSIS — E079 Disorder of thyroid, unspecified: Secondary | ICD-10-CM | POA: Diagnosis present

## 2012-08-11 DIAGNOSIS — Z87891 Personal history of nicotine dependence: Secondary | ICD-10-CM

## 2012-08-11 DIAGNOSIS — E785 Hyperlipidemia, unspecified: Secondary | ICD-10-CM | POA: Diagnosis present

## 2012-08-11 HISTORY — PX: LAPAROSCOPIC PARTIAL COLECTOMY: SHX5907

## 2012-08-11 HISTORY — DX: Nonspecific reaction to tuberculin skin test without active tuberculosis: R76.11

## 2012-08-11 HISTORY — PX: HEMICOLECTOMY: SHX854

## 2012-08-11 SURGERY — LAPAROSCOPIC PARTIAL COLECTOMY
Anesthesia: General | Site: Abdomen | Laterality: Right | Wound class: Contaminated

## 2012-08-11 MED ORDER — ARTIFICIAL TEARS OP OINT
TOPICAL_OINTMENT | OPHTHALMIC | Status: DC | PRN
  Administered 2012-08-11: 1 via OPHTHALMIC

## 2012-08-11 MED ORDER — NALOXONE HCL 0.4 MG/ML IJ SOLN: 0.4000 mg | INTRAMUSCULAR | Status: DC | PRN

## 2012-08-11 MED ORDER — ERTAPENEM SODIUM 1 G IJ SOLR
1.0000 g | INTRAMUSCULAR | Status: DC
  Filled 2012-08-11: qty 1

## 2012-08-11 MED ORDER — LIDOCAINE HCL (CARDIAC) 20 MG/ML IV SOLN
INTRAVENOUS | Status: DC | PRN
  Administered 2012-08-11: 25 mg via INTRAVENOUS
  Administered 2012-08-11: 75 mg via INTRAVENOUS

## 2012-08-11 MED ORDER — BUPIVACAINE-EPINEPHRINE (PF) 0.25% -1:200000 IJ SOLN
INTRAMUSCULAR | Status: DC | PRN
  Administered 2012-08-11: 13 mL

## 2012-08-11 MED ORDER — SODIUM CHLORIDE 0.9 % IV SOLN
INTRAVENOUS | Status: DC
  Administered 2012-08-11: 08:00:00 via INTRAVENOUS

## 2012-08-11 MED ORDER — HYDROMORPHONE 0.3 MG/ML IV SOLN
INTRAVENOUS | Status: AC
  Filled 2012-08-11: qty 25

## 2012-08-11 MED ORDER — ONDANSETRON HCL 4 MG PO TABS: 4.0000 mg | ORAL_TABLET | Freq: Four times a day (QID) | ORAL | Status: DC | PRN

## 2012-08-11 MED ORDER — MORPHINE SULFATE (PF) 1 MG/ML IV SOLN
INTRAVENOUS | Status: AC
  Filled 2012-08-11: qty 25

## 2012-08-11 MED ORDER — ALVIMOPAN 12 MG PO CAPS
12.0000 mg | ORAL_CAPSULE | Freq: Once | ORAL | Status: AC
  Administered 2012-08-11: 12 mg via ORAL
  Filled 2012-08-11: qty 1

## 2012-08-11 MED ORDER — LIDOCAINE HCL (PF) 1 % IJ SOLN
INTRAMUSCULAR | Status: AC
  Filled 2012-08-11: qty 5

## 2012-08-11 MED ORDER — BUPIVACAINE-EPINEPHRINE PF 0.25-1:200000 % IJ SOLN
INTRAMUSCULAR | Status: AC
  Filled 2012-08-11: qty 30

## 2012-08-11 MED ORDER — SODIUM CHLORIDE 0.9 % IR SOLN
Status: DC | PRN
  Administered 2012-08-11 (×2): 1000 mL

## 2012-08-11 MED ORDER — POTASSIUM CHLORIDE IN NACL 20-0.9 MEQ/L-% IV SOLN
INTRAVENOUS | Status: DC
  Administered 2012-08-11 – 2012-08-14 (×6): via INTRAVENOUS
  Filled 2012-08-11 (×8): qty 1000

## 2012-08-11 MED ORDER — ONDANSETRON HCL 4 MG/2ML IJ SOLN
4.0000 mg | Freq: Four times a day (QID) | INTRAMUSCULAR | Status: DC | PRN
  Administered 2012-08-12 – 2012-08-14 (×2): 4 mg via INTRAVENOUS
  Filled 2012-08-11 (×2): qty 2

## 2012-08-11 MED ORDER — SODIUM CHLORIDE 0.9 % IV SOLN
1.0000 g | Freq: Once | INTRAVENOUS | Status: AC
  Administered 2012-08-12: 1 g via INTRAVENOUS
  Filled 2012-08-11: qty 1

## 2012-08-11 MED ORDER — HYDROMORPHONE HCL PF 1 MG/ML IJ SOLN
INTRAMUSCULAR | Status: AC
  Filled 2012-08-11: qty 1

## 2012-08-11 MED ORDER — ALVIMOPAN 12 MG PO CAPS
12.0000 mg | ORAL_CAPSULE | Freq: Two times a day (BID) | ORAL | Status: DC
  Administered 2012-08-12 – 2012-08-14 (×5): 12 mg via ORAL
  Filled 2012-08-11 (×6): qty 1

## 2012-08-11 MED ORDER — DIPHENHYDRAMINE HCL 12.5 MG/5ML PO ELIX: 12.5000 mg | ORAL_SOLUTION | Freq: Four times a day (QID) | ORAL | Status: DC | PRN

## 2012-08-11 MED ORDER — ONDANSETRON HCL 4 MG/2ML IJ SOLN: 4.0000 mg | Freq: Once | INTRAMUSCULAR | Status: DC | PRN

## 2012-08-11 MED ORDER — ONDANSETRON HCL 4 MG/2ML IJ SOLN
INTRAMUSCULAR | Status: DC | PRN
  Administered 2012-08-11: 4 mg via INTRAVENOUS

## 2012-08-11 MED ORDER — ROCURONIUM BROMIDE 100 MG/10ML IV SOLN
INTRAVENOUS | Status: DC | PRN
  Administered 2012-08-11 (×2): 10 mg via INTRAVENOUS
  Administered 2012-08-11: 50 mg via INTRAVENOUS
  Administered 2012-08-11: 10 mg via INTRAVENOUS

## 2012-08-11 MED ORDER — LACTATED RINGERS IV SOLN
INTRAVENOUS | Status: DC | PRN
  Administered 2012-08-11 (×2): via INTRAVENOUS

## 2012-08-11 MED ORDER — FENTANYL CITRATE 0.05 MG/ML IJ SOLN
INTRAMUSCULAR | Status: DC | PRN
  Administered 2012-08-11 (×6): 50 ug via INTRAVENOUS
  Administered 2012-08-11: 75 ug via INTRAVENOUS
  Administered 2012-08-11: 125 ug via INTRAVENOUS

## 2012-08-11 MED ORDER — NEOSTIGMINE METHYLSULFATE 1 MG/ML IJ SOLN
INTRAMUSCULAR | Status: DC | PRN
  Administered 2012-08-11: 4 mg via INTRAVENOUS

## 2012-08-11 MED ORDER — ONDANSETRON HCL 4 MG/2ML IJ SOLN
4.0000 mg | Freq: Four times a day (QID) | INTRAMUSCULAR | Status: DC | PRN
  Filled 2012-08-11: qty 2

## 2012-08-11 MED ORDER — HYDROMORPHONE 0.3 MG/ML IV SOLN
INTRAVENOUS | Status: DC
  Administered 2012-08-11: 0.6 mg via INTRAVENOUS
  Administered 2012-08-11: 0.3 mg via INTRAVENOUS
  Administered 2012-08-11: 16:00:00 via INTRAVENOUS
  Administered 2012-08-12: 0.6 mg via INTRAVENOUS
  Administered 2012-08-12: 0.9 mg via INTRAVENOUS
  Administered 2012-08-12 (×2): 1.5 mg via INTRAVENOUS
  Administered 2012-08-12: 0.3 mg via INTRAVENOUS
  Administered 2012-08-12: 1.8 mg via INTRAVENOUS
  Administered 2012-08-13 (×2): 0.6 mg via INTRAVENOUS
  Administered 2012-08-13: 0.3 mg via INTRAVENOUS
  Administered 2012-08-13: 0.6 mg via INTRAVENOUS
  Filled 2012-08-11: qty 25

## 2012-08-11 MED ORDER — SODIUM CHLORIDE 0.9 % IJ SOLN: 9.0000 mL | INTRAMUSCULAR | Status: DC | PRN

## 2012-08-11 MED ORDER — ENOXAPARIN SODIUM 40 MG/0.4ML ~~LOC~~ SOLN
40.0000 mg | SUBCUTANEOUS | Status: DC
  Administered 2012-08-12 – 2012-08-15 (×5): 40 mg via SUBCUTANEOUS
  Filled 2012-08-11 (×8): qty 0.4

## 2012-08-11 MED ORDER — GLYCOPYRROLATE 0.2 MG/ML IJ SOLN
INTRAMUSCULAR | Status: DC | PRN
  Administered 2012-08-11: 0.2 mg via INTRAVENOUS
  Administered 2012-08-11: 0.6 mg via INTRAVENOUS

## 2012-08-11 MED ORDER — PHENYLEPHRINE HCL 10 MG/ML IJ SOLN
INTRAMUSCULAR | Status: DC | PRN
  Administered 2012-08-11: 80 ug via INTRAVENOUS

## 2012-08-11 MED ORDER — LEVOTHYROXINE SODIUM 100 MCG PO TABS
100.0000 ug | ORAL_TABLET | Freq: Every day | ORAL | Status: DC
  Administered 2012-08-12 – 2012-08-16 (×5): 100 ug via ORAL
  Filled 2012-08-11 (×6): qty 1

## 2012-08-11 MED ORDER — DIPHENHYDRAMINE HCL 50 MG/ML IJ SOLN
12.5000 mg | Freq: Four times a day (QID) | INTRAMUSCULAR | Status: DC | PRN
  Administered 2012-08-12: 12.5 mg via INTRAVENOUS
  Filled 2012-08-11: qty 1

## 2012-08-11 MED ORDER — HYDROMORPHONE HCL PF 1 MG/ML IJ SOLN
0.2500 mg | INTRAMUSCULAR | Status: DC | PRN
  Administered 2012-08-11: 0.5 mg via INTRAVENOUS
  Administered 2012-08-11: 16:00:00 via INTRAVENOUS
  Administered 2012-08-11 (×2): 0.5 mg via INTRAVENOUS

## 2012-08-11 MED ORDER — PROPOFOL 10 MG/ML IV BOLUS
INTRAVENOUS | Status: DC | PRN
  Administered 2012-08-11: 200 mg via INTRAVENOUS

## 2012-08-11 MED ORDER — 0.9 % SODIUM CHLORIDE (POUR BTL) OPTIME
TOPICAL | Status: DC | PRN
  Administered 2012-08-11 (×2): 1000 mL

## 2012-08-11 SURGICAL SUPPLY — 73 items
APPLIER CLIP 5 13 M/L LIGAMAX5 (MISCELLANEOUS)
APPLIER CLIP ROT 10 11.4 M/L (STAPLE)
BENZOIN TINCTURE PRP APPL 2/3 (GAUZE/BANDAGES/DRESSINGS) ×2 IMPLANT
BLADE SURG 10 STRL SS (BLADE) ×2 IMPLANT
BLADE SURG ROTATE 9660 (MISCELLANEOUS) ×4 IMPLANT
CANISTER SUCTION 2500CC (MISCELLANEOUS) ×2 IMPLANT
CELLS DAT CNTRL 66122 CELL SVR (MISCELLANEOUS) IMPLANT
CHLORAPREP W/TINT 26ML (MISCELLANEOUS) ×2 IMPLANT
CLIP APPLIE 5 13 M/L LIGAMAX5 (MISCELLANEOUS) IMPLANT
CLIP APPLIE ROT 10 11.4 M/L (STAPLE) IMPLANT
CLOTH BEACON ORANGE TIMEOUT ST (SAFETY) ×2 IMPLANT
COVER MAYO STAND STRL (DRAPES) ×2 IMPLANT
COVER SURGICAL LIGHT HANDLE (MISCELLANEOUS) ×2 IMPLANT
DECANTER SPIKE VIAL GLASS SM (MISCELLANEOUS) IMPLANT
DRAPE PROXIMA HALF (DRAPES) IMPLANT
DRAPE UTILITY 15X26 W/TAPE STR (DRAPE) ×6 IMPLANT
DRSG TEGADERM 2-3/8X2-3/4 SM (GAUZE/BANDAGES/DRESSINGS) ×8 IMPLANT
DRSG TEGADERM 4X4.75 (GAUZE/BANDAGES/DRESSINGS) ×2 IMPLANT
ELECT CAUTERY BLADE 6.4 (BLADE) ×4 IMPLANT
ELECT REM PT RETURN 9FT ADLT (ELECTROSURGICAL) ×2
ELECTRODE REM PT RTRN 9FT ADLT (ELECTROSURGICAL) ×1 IMPLANT
GAUZE SPONGE 2X2 8PLY STRL LF (GAUZE/BANDAGES/DRESSINGS) ×3 IMPLANT
GEL ULTRASOUND 20GR AQUASONIC (MISCELLANEOUS) IMPLANT
GLOVE BIO SURGEON STRL SZ7 (GLOVE) ×8 IMPLANT
GLOVE BIOGEL PI IND STRL 7.0 (GLOVE) ×2 IMPLANT
GLOVE BIOGEL PI IND STRL 7.5 (GLOVE) ×2 IMPLANT
GLOVE BIOGEL PI INDICATOR 7.0 (GLOVE) ×2
GLOVE BIOGEL PI INDICATOR 7.5 (GLOVE) ×2
GLOVE ECLIPSE 7.0 STRL STRAW (GLOVE) ×4 IMPLANT
GLOVE SURG SS PI 7.0 STRL IVOR (GLOVE) ×4 IMPLANT
GOWN STRL NON-REIN LRG LVL3 (GOWN DISPOSABLE) ×10 IMPLANT
KIT BASIN OR (CUSTOM PROCEDURE TRAY) ×2 IMPLANT
KIT ROOM TURNOVER OR (KITS) ×2 IMPLANT
LEGGING LITHOTOMY PAIR STRL (DRAPES) IMPLANT
LIGASURE 5MM LAPAROSCOPIC (INSTRUMENTS) IMPLANT
LIGASURE IMPACT 36 18CM CVD LR (INSTRUMENTS) ×2 IMPLANT
NS IRRIG 1000ML POUR BTL (IV SOLUTION) ×4 IMPLANT
PAD ARMBOARD 7.5X6 YLW CONV (MISCELLANEOUS) ×4 IMPLANT
PAD SHARPS MAGNETIC DISPOSAL (MISCELLANEOUS) ×2 IMPLANT
PENCIL BUTTON HOLSTER BLD 10FT (ELECTRODE) ×2 IMPLANT
RELOAD PROXIMATE 75MM BLUE (ENDOMECHANICALS) ×4 IMPLANT
RTRCTR WOUND ALEXIS 18CM MED (MISCELLANEOUS)
SCALPEL HARMONIC ACE (MISCELLANEOUS) ×2 IMPLANT
SCISSORS LAP 5X35 DISP (ENDOMECHANICALS) IMPLANT
SET IRRIG TUBING LAPAROSCOPIC (IRRIGATION / IRRIGATOR) ×2 IMPLANT
SLEEVE ENDOPATH XCEL 5M (ENDOMECHANICALS) ×6 IMPLANT
SPECIMEN JAR LARGE (MISCELLANEOUS) IMPLANT
SPONGE GAUZE 2X2 STER 10/PKG (GAUZE/BANDAGES/DRESSINGS) ×3
SPONGE LAP 18X18 X RAY DECT (DISPOSABLE) ×4 IMPLANT
STAPLER GUN LINEAR PROX 60 (STAPLE) ×2 IMPLANT
STAPLER PROXIMATE 75MM BLUE (STAPLE) ×2 IMPLANT
STAPLER VISISTAT 35W (STAPLE) ×2 IMPLANT
STRIP CLOSURE SKIN 1/2X4 (GAUZE/BANDAGES/DRESSINGS) ×2 IMPLANT
SURGILUBE 2OZ TUBE FLIPTOP (MISCELLANEOUS) IMPLANT
SUT MNCRL AB 4-0 PS2 18 (SUTURE) ×4 IMPLANT
SUT PDS AB 1 CTX 36 (SUTURE) ×4 IMPLANT
SUT PROLENE 2 0 CT2 30 (SUTURE) IMPLANT
SUT PROLENE 2 0 KS (SUTURE) IMPLANT
SUT SILK 2 0 SH CR/8 (SUTURE) ×2 IMPLANT
SUT SILK 2 0 TIES 10X30 (SUTURE) ×2 IMPLANT
SUT SILK 3 0 SH CR/8 (SUTURE) ×4 IMPLANT
SUT SILK 3 0 TIES 10X30 (SUTURE) ×2 IMPLANT
SYR BULB IRRIGATION 50ML (SYRINGE) ×4 IMPLANT
SYS LAPSCP GELPORT 120MM (MISCELLANEOUS) ×2
SYSTEM LAPSCP GELPORT 120MM (MISCELLANEOUS) ×1 IMPLANT
TOWEL OR 17X26 10 PK STRL BLUE (TOWEL DISPOSABLE) ×4 IMPLANT
TRAY FOLEY CATH 14FRSI W/METER (CATHETERS) ×2 IMPLANT
TRAY LAPAROSCOPIC (CUSTOM PROCEDURE TRAY) ×2 IMPLANT
TROCAR XCEL NON-BLD 11X100MML (ENDOMECHANICALS) IMPLANT
TROCAR XCEL NON-BLD 5MMX100MML (ENDOMECHANICALS) ×2 IMPLANT
TUBE CONNECTING 12X1/4 (SUCTIONS) ×2 IMPLANT
WATER STERILE IRR 1000ML POUR (IV SOLUTION) IMPLANT
YANKAUER SUCT BULB TIP NO VENT (SUCTIONS) ×4 IMPLANT

## 2012-08-11 NOTE — Interval H&P Note (Signed)
History and Physical Interval Note:  08/11/2012 7:03 AM  Robert Hoover  has presented today for surgery, with the diagnosis of right colon mass   The various methods of treatment have been discussed with the patient and family. After consideration of risks, benefits and other options for treatment, the patient has consented to  Procedure(s): LAPAROSCOPIC Assisted RIGHT HEMI COLECTOMY (Right) as a surgical intervention .  The patient's history has been reviewed, patient examined, no change in status, stable for surgery.  I have reviewed the patient's chart and labs.  Questions were answered to the patient's satisfaction.    Pre-op Hgb 7.0.  Will transfuse 2 units PRBC prior to surgery.   [name] K.

## 2012-08-11 NOTE — Preoperative (Signed)
Beta Blockers   Reason not to administer Beta Blockers:Not Applicable 

## 2012-08-11 NOTE — Anesthesia Postprocedure Evaluation (Signed)
  Anesthesia Post-op Note  Patient: Robert Hoover  Procedure(s) Performed: Procedure(s): LAPAROSCOPIC ASSISTED RIGHT HEMICOLECTOMY (Right)  Patient Location: PACU  Anesthesia Type:General  Level of Consciousness: awake, alert , oriented and patient cooperative  Airway and Oxygen Therapy: Patient Spontanous Breathing  Post-op Pain: moderate  Post-op Assessment: Post-op Vital signs reviewed, Patient's Cardiovascular Status Stable, Respiratory Function Stable, Patent Airway, No signs of Nausea or vomiting and Pain level controlled  Post-op Vital Signs: stable  Complications: No apparent anesthesia complications

## 2012-08-11 NOTE — Progress Notes (Signed)
Chaplain Note: Chaplain visited with pt and pt's spouse.  Pt had questions about advanced directives.  Chaplain discussed the matter with pt and pt's spouse.  Pt and spouse have discussed pt's health care wishes.  Since the pt and spouse have no children, end of life matters, should those arise, are less complicated.  Pt's spouse is comfortable making health care decisions because she fully understands pt's wishes.  Chaplain recommended that following his surgery, pt complete at least the [facility] portion of the advanced directives.  Pt understands that the document can be notarized during his stay.    Chaplain provided spiritual comfort and support for pt and pt's spouse.  Chaplain read a selection of scripture and prayed with the couple, providing encouragement for the upcoming procedure.  Pt and spouse expressed appreciation for chaplain support.  Chaplain will follow up as needed.   08/11/12 1000  Clinical Encounter Type  Visited With Patient and family together  Visit Type Spiritual support;Other (Comment) (Advanced directives discussion)  Referral From Nurse  Spiritual Encounters  Spiritual Needs Sacred text;Prayer;Emotional  Stress Factors  Patient Stress Factors Health changes;Major life changes  Family Stress Factors Lack of caregivers;Major life changes  Advance Directives (For Healthcare)  Advance Directive Patient does not have advance directive  Patient requests advance directive information Advance directive packet given;Other (Comment) (Discussed advanced directives with pt and pt's spouse)  Verdie Shire, Chaplain [phone]

## 2012-08-11 NOTE — Op Note (Signed)
Pre-op Diagnosis - Right colon mass - villous adenoma with dysplasia Postop diagnosis: Same Procedure: Laparoscopic assisted right hemicolectomy Surgeon:Tisheena Maguire K. Assistant: Dr. Luretha Murphy Anesthesia: Gen. Endotracheal Indications: This is a 57 year old male who previously had a tubulovillous adenoma of the right colon. This was diagnosed 4 years ago. The patient declined surgical intervention at that time.  Recently on routine physical examination he was found to be profoundly anemic with a hemoglobin of 7.9. EGD was unremarkable. Colonoscopy showed a large mass lesion in the ascending colon which was biopsied showing a villous adenoma with focal high-grade dysplasia. The patient is now referred for surgical evaluation. He was transfused 2 units of packed red blood cells prior to surgery. His preop hemoglobin was 7.0  Description of procedure: The patient brought to the operating room placed in supine position on the operating room table. After an adequate level of general anesthesia was obtained a Foley catheter was placed under sterile technique. The patient's abdomen was shaved prepped with chlor prep and draped sterile fashion. A timeout was taken to ensure the proper patient proper procedure. We used a 5 mm Optiview trocar to cannulate the peritoneal cavity in the left upper quadrant. We insufflated CO2 maintaining a maximum pressure of 15 mm mercury. The laparoscope was inserted and the patient was positioned in Trendelenburg tilted to his left. We placed 3 additional 5 mm ports, one in the epigastrium, one in the left lower quadrant, one in the right lower quadrant. We then used the harmonic scalpel to mobilize the right colon medially. We continued our dissection up around the hepatic flexure. The patient has had previous laparoscopic cholecystectomy and has significant adhesions to the liver. These were taken down with the harmonic scalpel. Once with thoroughly mobilized the colon to the  mid transverse colon we turned our attention to the right lower quadrant. The appendix and the terminal ileum were mobilized away from the lateral abdominal wall with the harmonic scalpel. We then made a 7 cm midline incision around the umbilicus. The fascia was opened and the GelPort device was inserted. I inserted my left hand and was able to complete the mobilization of the right colon bluntly. Once the right colon was adequately mobilized we released our insufflation and exteriorized the colon. The mass was easily palpable and cecum and had been marked with [place] ink. We divided the transverse colon proximal to the middle colic artery with a GIA-75 stapler. The mesentery was divided with the LigaSure device. We divided the terminal ileum about 10 cm proximal to the ileocecal valve. We divided the mesentery completely with the LigaSure device. The specimen was passed off the field sent for pathologic examination. We then inspected for hemostasis. We created a side-to-side ileocolic anastomosis with a GIA-75 stapler. A reinforcing suture of 3-0 silk was placed at the crotch of the staple line. The common bowel defect was closed with a TA 60 stapler. The mesenteric defect was reapproximated with 2-0 silk. 3-0 silk was used to oversew the staple line. The bowel anastomosis was palpated and was widely patent. We then thoroughly irrigated the abdomen with several liters of warm saline. We replaced GelPort device and reinsufflated CO2 maintaining a maximum pressure of 15 mm mercury. Inspected with the laparoscope and thoroughly irrigated the right paracolic gutter. Hemostasis was good. We then removed our trocars as insufflation was released. The fascia of the midline incision was reapproximated with #1 PDS. 4 Monocryl was used to close all skin incisions. Steri-Strips and clean  dressings were applied. The patient was then extubated and brought to recovery in stable condition. All sponge, initially, and needle counts  are correct.  Robert Hoover. Robert Skains, MD, [facility] Surgery  General/ Trauma Surgery  08/11/2012 2:59 PM

## 2012-08-11 NOTE — H&P (View-Only) (Signed)
Patient ID: Robert Hoover, male   DOB: December 16, 1955, 57 y.o.   MRN: [id number]  No chief complaint on file.   HPI Robert Hoover is a 57 y.o. male.  PCP - Dr. Bernadette Hoover.  Referred by Dr. Loman Hoover, [facility] for evaluation of bleeding right colon mass HPI This is a 57 year old Hispanic male who had previously undergone a colonoscopy in 2010 by Dr. Lina Hoover which showed a tubulovillous adenoma of the right colon. Surgical consult was recommended at that time but the patient declined. Recently he had a routine physical examination. At that time he was complaining of shortness of breath and fatigue. He was found to be profoundly anemic with a hemoglobin of 7.9. He was then referred to Robert Hoover for evaluation. He underwent EGD and colonoscopy on 07/10/12. The EGD was unremarkable. The colonoscopy showed a large mass lesion in the a sending colon which was tattooed with Robert Hoover and. This mass is large and fungating and fairly friable. It involves 3/4 of the circumference of the colon measuring about 5 cm in length. Biopsies were performed which showed a villous adenoma with focal high-grade dysplasia. 2 other smaller  Polyps were biopsied and showed only tubular adenomas. He is now referred for surgical evaluation for resection. It is unclear to me whether the patient is on any iron supplements or has had a blood transfusions.  Over the last couple of days the patient has been having a lot of urinary symptoms as well as fevers and low pelvic pain. He was diagnosed with prostatitis and is currently undergoing treatment with ciprofloxacin. He still does not feel well.  Past Medical History  Diagnosis Date  . Asthma   . Hyperlipidemia   . Thyroid disease   . Anemia     Past Surgical History  Procedure Laterality Date  . Cholecystectomy    Lap chole 2010 Dr. Freida Hoover  Family History  Problem Relation Age of Onset  . Kidney cancer Father     Social History History  Substance Use Topics  . Smoking  status: Former Smoker    Start date: 07/24/1982  . Smokeless tobacco: Not on file  . Alcohol Use: No    No Known Allergies  Current Outpatient Prescriptions  Medication Sig Dispense Refill  . acetaminophen (TYLENOL) 100 MG/ML solution Take 10 mg/kg by mouth every 4 (four) hours as needed for fever.      . ciprofloxacin (CIPRO) 500 MG/5ML (10%) suspension Take by mouth 2 (two) times daily.      Marland Kitchen levothyroxine (SYNTHROID, LEVOTHROID) 100 MCG tablet Take 100 mcg by mouth daily before breakfast.       No current facility-administered medications for this visit.    Review of Systems Review of Systems  Constitutional: Positive for fever, chills and activity change. Negative for unexpected weight change.  HENT: Negative for hearing loss, congestion, sore throat, trouble swallowing and voice change.   Eyes: Negative for visual disturbance.  Respiratory: Negative for cough and wheezing.   Cardiovascular: Negative for chest pain, palpitations and leg swelling.  Gastrointestinal: Positive for blood in stool. Negative for nausea, vomiting, abdominal pain, diarrhea, constipation, abdominal distention, anal bleeding and rectal pain.  Genitourinary: Positive for urgency and difficulty urinating. Negative for hematuria.  Musculoskeletal: Negative for arthralgias.  Skin: Negative for rash and wound.  Neurological: Negative for seizures, syncope, weakness and headaches.  Hematological: Negative for adenopathy. Does not bruise/bleed easily.  Psychiatric/Behavioral: Negative for confusion.    Blood pressure 124/70, pulse 78,  temperature 98.6 F (37 C), resp. rate 17, height 5\' 3"  (1.6 m), weight 165 lb 9.6 oz (75.116 kg).  Physical Exam Physical Exam WDWN in NAD HEENT:  EOMI, sclera anicteric Neck:  No masses, no thyromegaly Lungs:  CTA bilaterally; normal respiratory effort CV:  Regular rate and rhythm; no murmurs Abd:  +bowel sounds, soft, non-tender, healed laparoscopic incisions Ext:   Well-perfused; no edema Skin:  Warm, dry; no sign of jaundice  Data Reviewed Records, colonoscopy report, and pathology report from [facility]  Assessment    Large right colon mass - villous adenoma with high-grade dysplasia on biopsy     Plan    Recommend laparoscopic-assisted right hemicolectomy.  The surgical procedure has been discussed with the patient.  Potential risks, benefits, alternative treatments, and expected outcomes have been explained.  All of the patient's questions at this time have been answered.  The likelihood of reaching the patient's treatment goal is good.  The patient understand the proposed surgical procedure and wishes to proceed. The patient understands the possibility of finding invasive cancer in the polyp and the need for possible further surgery.          Robert Hoover K. 07/23/2012, 5:17 PM

## 2012-08-11 NOTE — Transfer of Care (Signed)
Immediate Anesthesia Transfer of Care Note  Patient: Robert Hoover  Procedure(s) Performed: Procedure(s): LAPAROSCOPIC ASSISTED RIGHT HEMICOLECTOMY (Right)  Patient Location: PACU  Anesthesia Type:General  Level of Consciousness: awake and alert   Airway & Oxygen Therapy: Patient Spontanous Breathing and Patient connected to nasal cannula oxygen  Post-op Assessment: Report given to PACU RN and Post -op Vital signs reviewed and stable  Post vital signs: Reviewed and stable  Complications: No apparent anesthesia complications

## 2012-08-11 NOTE — Anesthesia Preprocedure Evaluation (Addendum)
Anesthesia Evaluation  Patient identified by MRN, date of birth, ID band Patient awake    Reviewed: Allergy & Precautions, H&P , NPO status , Patient's Chart, lab work & pertinent test results  Airway Mallampati: I TM Distance: >3 FB Neck ROM: full    Dental   Pulmonary asthma ,          Cardiovascular Rhythm:regular Rate:Normal     Neuro/Psych    GI/Hepatic   Endo/Other  Hyperthyroidism   Renal/GU      Musculoskeletal   Abdominal   Peds  Hematology  (+) anemia ,   Anesthesia Other Findings   Reproductive/Obstetrics                           Anesthesia Physical Anesthesia Plan  ASA: II  Anesthesia Plan: General   Post-op Pain Management:    Induction: Intravenous  Airway Management Planned: Oral ETT  Additional Equipment:   Intra-op Plan:   Post-operative Plan: Extubation in OR  Informed Consent: I have reviewed the patients History and Physical, chart, labs and discussed the procedure including the risks, benefits and alternatives for the proposed anesthesia with the patient or authorized representative who has indicated his/her understanding and acceptance.     Plan Discussed with: CRNA, Anesthesiologist and Surgeon  Anesthesia Plan Comments:         Anesthesia Quick Evaluation

## 2012-08-12 ENCOUNTER — Encounter (HOSPITAL_COMMUNITY): Payer: Self-pay | Admitting: General Practice

## 2012-08-12 LAB — BASIC METABOLIC PANEL
Chloride: 102 mEq/L (ref 96–112)
GFR calc Af Amer: >90 mL/min (ref >90)
GFR calc non Af Amer: >90 mL/min (ref >90)
Glucose, Bld: 132 mg/dL — ABNORMAL HIGH (ref 70–99)
Potassium: 4.1 mEq/L (ref 3.5–5.1)
Sodium: 139 mEq/L (ref 135–145)

## 2012-08-12 LAB — CBC
HCT: 27.8 % — ABNORMAL LOW (ref 39.0–52.0)
Hemoglobin: 8.2 g/dL — ABNORMAL LOW (ref 13.0–17.0)
MCHC: 29.5 g/dL — ABNORMAL LOW (ref 30.0–36.0)
WBC: 10.7 10*3/uL — ABNORMAL HIGH (ref 4.0–10.5)

## 2012-08-12 LAB — TYPE AND SCREEN
Antibody Screen: NEGATIVE
Unit division: 0

## 2012-08-12 MED ORDER — KETOROLAC TROMETHAMINE 15 MG/ML IJ SOLN
15.0000 mg | Freq: Four times a day (QID) | INTRAMUSCULAR | Status: DC
  Administered 2012-08-12 – 2012-08-15 (×13): 15 mg via INTRAVENOUS
  Filled 2012-08-12 (×24): qty 1

## 2012-08-12 MED ORDER — PANTOPRAZOLE SODIUM 20 MG PO TBEC
20.0000 mg | DELAYED_RELEASE_TABLET | Freq: Every day | ORAL | Status: DC
  Administered 2012-08-12 – 2012-08-16 (×5): 20 mg via ORAL
  Filled 2012-08-12 (×5): qty 1

## 2012-08-12 MED ORDER — ALUM & MAG HYDROXIDE-SIMETH 200-200-20 MG/5ML PO SUSP
30.0000 mL | ORAL | Status: DC | PRN
  Administered 2012-08-12 – 2012-08-14 (×3): 30 mL via ORAL
  Filled 2012-08-12 (×3): qty 30

## 2012-08-12 NOTE — Progress Notes (Signed)
1 Day Post-Op  Subjective   Sore, nausea improved from last night.  C/o some heartburn Tolerating clears Has not yet voided since Foley removed at 0600 Using PCA with good relief  Objective: Vital signs in last 24 hours: Temp:  [97.8 F (36.6 C)-99.1 F (37.3 C)] 98.4 F (36.9 C) (06/11 0626) Pulse Rate:  [68-86] 78 (06/11 0626) Resp:  [11-25] 16 (06/11 0754) BP: (112-139)/(54-81) 114/61 mmHg (06/11 0626) SpO2:  [95 %-100 %] 96 % (06/11 0754)    Intake/Output from previous day: 06/10 0701 - 06/11 0700 In: 4076 [P.O.:296; I.V.:3080; Blood:700] Out: 1600 [Urine:1450; Blood:150] Intake/Output this shift:    General appearance: alert, cooperative and no distress Resp: clear to auscultation bilaterally Cardio: regular rate and rhythm, S1, S2 normal, no murmur, click, rub or gallop GI: mildly distended; occasional bowel sounds Dressing over midline wound with drainage; removed and replaced  Lab Results:   Recent Labs  08/12/12 0520  WBC 10.7*  HGB 8.2*  HCT 27.8*  PLT 310   BMET  Recent Labs  08/12/12 0520  NA 139  K 4.1  CL 102  CO2 28  GLUCOSE 132*  BUN 13  CREATININE 0.90  CALCIUM 8.5   PT/INR No results found for this basename: LABPROT, INR,  in the last 72 hours ABG No results found for this basename: PHART, PCO2, PO2, HCO3,  in the last 72 hours  Studies/Results: No results found.  Anti-infectives: Anti-infectives   Start     Dose/Rate Route Frequency Ordered Stop   08/12/12 1300  ertapenem (INVANZ) 1 g in sodium chloride 0.9 % 50 mL IVPB     1 g 100 mL/hr over 30 Minutes Intravenous  Once 08/11/12 2005     08/11/12 1830  ertapenem (INVANZ) 1 g in sodium chloride 0.9 % 50 mL IVPB  Status:  Discontinued     1 g 100 mL/hr over 30 Minutes Intravenous Every 24 hours 08/11/12 1738 08/11/12 2005   08/11/12 0600  ertapenem (INVANZ) 1 g in sodium chloride 0.9 % 50 mL IVPB     1 g 100 mL/hr over 30 Minutes Intravenous On call to O.R. 08/10/12 1410  08/11/12 1242      Assessment/Plan: s/p Procedure(s): LAPAROSCOPIC ASSISTED RIGHT HEMICOLECTOMY (Right) Ambulate Awaiting bowel function Continue Entereg Use PCA Clear liquids only until bowel function Add Toradol Protonix/ Maalox  LOS: 1 day    Amere Bricco K. 08/12/2012

## 2012-08-12 NOTE — Progress Notes (Signed)
UR completed 

## 2012-08-12 NOTE — Progress Notes (Signed)
Orthopedic Tech Progress Note Patient Details:  Robert Hoover 1955-08-31 [id number]  Ortho Devices Type of Ortho Device: Abdominal binder Ortho Device/Splint Interventions: Ordered   Shawnie Pons 08/12/2012, 12:36 PM

## 2012-08-13 ENCOUNTER — Encounter (HOSPITAL_COMMUNITY): Payer: Self-pay | Admitting: Surgery

## 2012-08-13 LAB — BASIC METABOLIC PANEL
BUN: 9 mg/dL (ref 6–23)
CO2: 30 mEq/L (ref 19–32)
Glucose, Bld: 94 mg/dL (ref 70–99)
Potassium: 4 mEq/L (ref 3.5–5.1)
Sodium: 138 mEq/L (ref 135–145)

## 2012-08-13 LAB — CBC
HCT: 25.2 % — ABNORMAL LOW (ref 39.0–52.0)
Hemoglobin: 7.4 g/dL — ABNORMAL LOW (ref 13.0–17.0)
RBC: 3.82 MIL/uL — ABNORMAL LOW (ref 4.22–5.81)

## 2012-08-13 MED ORDER — HYDROMORPHONE HCL PF 1 MG/ML IJ SOLN: 1.0000 mg | INTRAMUSCULAR | Status: DC | PRN

## 2012-08-13 MED ORDER — OXYCODONE-ACETAMINOPHEN 5-325 MG PO TABS
1.0000 | ORAL_TABLET | ORAL | Status: DC | PRN
  Administered 2012-08-13: 1 via ORAL
  Filled 2012-08-13 (×2): qty 1

## 2012-08-13 NOTE — Progress Notes (Addendum)
2 Days Post-Op  Subjective: Much less sore. No nausea No flatus yet, but good bowel sounds Ambulating Voiding without difficulty Minimal PCA use  Objective: Vital signs in last 24 hours: Temp:  [98 F (36.7 C)-98.6 F (37 C)] 98.6 F (37 C) (06/12 0550) Pulse Rate:  [61-82] 79 (06/12 0550) Resp:  [16-22] 16 (06/12 0843) BP: (111-127)/(64-77) 127/72 mmHg (06/12 0550) SpO2:  [98 %-100 %] 99 % (06/12 0843) Last BM Date: 08/10/12  Intake/Output from previous day: 06/11 0701 - 06/12 0700 In: 2489 [I.V.:2439; IV Piggyback:50] Out: 1075 [Urine:1075] Intake/Output this shift: Total I/O In: -  Out: 200 [Urine:200]  General appearance: alert, cooperative and no distress Resp: clear to auscultation bilaterally GI: mildly distended; good bowel sounds Laparoscopic ports c/d/i; midline with some minimal thin serous drainage  Lab Results:   Recent Labs  08/12/12 0520 08/13/12 0450  WBC 10.7* 7.3  HGB 8.2* 7.4*  HCT 27.8* 25.2*  PLT 310 258   BMET  Recent Labs  08/12/12 0520 08/13/12 0450  NA 139 138  K 4.1 4.0  CL 102 104  CO2 28 30  GLUCOSE 132* 94  BUN 13 9  CREATININE 0.90 0.93  CALCIUM 8.5 8.1*   PT/INR No results found for this basename: LABPROT, INR,  in the last 72 hours ABG No results found for this basename: PHART, PCO2, PO2, HCO3,  in the last 72 hours  Studies/Results: No results found.  Anti-infectives: Anti-infectives   Start     Dose/Rate Route Frequency Ordered Stop   08/12/12 1300  ertapenem (INVANZ) 1 g in sodium chloride 0.9 % 50 mL IVPB     1 g 100 mL/hr over 30 Minutes Intravenous  Once 08/11/12 2005 08/12/12 1325   08/11/12 1830  ertapenem (INVANZ) 1 g in sodium chloride 0.9 % 50 mL IVPB  Status:  Discontinued     1 g 100 mL/hr over 30 Minutes Intravenous Every 24 hours 08/11/12 1738 08/11/12 2005   08/11/12 0600  ertapenem (INVANZ) 1 g in sodium chloride 0.9 % 50 mL IVPB     1 g 100 mL/hr over 30 Minutes Intravenous On call to  O.R. 08/10/12 1410 08/11/12 1242      Assessment/Plan: s/p Procedure(s): LAPAROSCOPIC ASSISTED RIGHT HEMICOLECTOMY (Right) Continue clears D/C PCA PO pain meds Awaiting bowel function Path pending Hgb 8.0 down to 7.4 - not tachycardic - chronic anemia with some intraoperative blood loss; will hold off transfusion for now as the patient is asymptomatic.   LOS: 2 days    Robert Hoover K. 08/13/2012

## 2012-08-14 LAB — CBC
HCT: 27.3 % — ABNORMAL LOW (ref 39.0–52.0)
Hemoglobin: 8 g/dL — ABNORMAL LOW (ref 13.0–17.0)
MCH: 19.5 pg — ABNORMAL LOW (ref 26.0–34.0)
MCV: 66.6 fL — ABNORMAL LOW (ref 78.0–100.0)
RBC: 4.1 MIL/uL — ABNORMAL LOW (ref 4.22–5.81)

## 2012-08-14 LAB — BASIC METABOLIC PANEL
BUN: 15 mg/dL (ref 6–23)
CO2: 28 mEq/L (ref 19–32)
Calcium: 8.7 mg/dL (ref 8.4–10.5)
Creatinine, Ser: 0.86 mg/dL (ref 0.50–1.35)
Glucose, Bld: 102 mg/dL — ABNORMAL HIGH (ref 70–99)

## 2012-08-14 MED ORDER — ONDANSETRON HCL 4 MG PO TABS: 4.0000 mg | ORAL_TABLET | Freq: Four times a day (QID) | ORAL | Status: DC | PRN

## 2012-08-14 MED ORDER — PROMETHAZINE HCL 25 MG/ML IJ SOLN
12.5000 mg | Freq: Four times a day (QID) | INTRAMUSCULAR | Status: DC | PRN
  Administered 2012-08-14 (×2): 12.5 mg via INTRAVENOUS
  Filled 2012-08-14 (×3): qty 1

## 2012-08-14 MED ORDER — ONDANSETRON HCL 4 MG/2ML IJ SOLN
4.0000 mg | INTRAMUSCULAR | Status: DC | PRN
  Administered 2012-08-14: 4 mg via INTRAVENOUS
  Filled 2012-08-14: qty 2

## 2012-08-14 NOTE — Progress Notes (Signed)
3 Days Post-Op  Subjective: Had some loose BM yesterday with small amount of blood. Very nauseated and distended today. Minimal pain Voiding well.   Objective: Vital signs in last 24 hours: Temp:  [97.7 F (36.5 C)-99 F (37.2 C)] 99 F (37.2 C) (06/13 1350) Pulse Rate:  [77-108] 108 (06/13 1350) Resp:  [18-20] 20 (06/13 1350) BP: (114-125)/(66-83) 125/83 mmHg (06/13 1350) SpO2:  [98 %-100 %] 99 % (06/13 1350) Last BM Date: 08/13/12  Intake/Output from previous day: 06/12 0701 - 06/13 0700 In: 3332.5 [P.O.:600; I.V.:2732.5] Out: 400 [Urine:400] Intake/Output this shift: Total I/O In: 240 [P.O.:240] Out: -   General appearance: alert, cooperative and no distress Resp: clear to auscultation bilaterally Cardio: regular rate and rhythm, S1, S2 normal, no murmur, click, rub or gallop GI: distended, hypoactive BS Incisions c/d/i  Lab Results:   Recent Labs  08/13/12 0450 08/14/12 0535  WBC 7.3 7.8  HGB 7.4* 8.0*  HCT 25.2* 27.3*  PLT 258 258   BMET  Recent Labs  08/13/12 0450 08/14/12 0535  NA 138 135  K 4.0 4.2  CL 104 101  CO2 30 28  GLUCOSE 94 102*  BUN 9 15  CREATININE 0.93 0.86  CALCIUM 8.1* 8.7   PT/INR No results found for this basename: LABPROT, INR,  in the last 72 hours ABG No results found for this basename: PHART, PCO2, PO2, HCO3,  in the last 72 hours  Studies/Results: No results found.  Anti-infectives: Anti-infectives   Start     Dose/Rate Route Frequency Ordered Stop   08/12/12 1300  ertapenem (INVANZ) 1 g in sodium chloride 0.9 % 50 mL IVPB     1 g 100 mL/hr over 30 Minutes Intravenous  Once 08/11/12 2005 08/12/12 1325   08/11/12 1830  ertapenem (INVANZ) 1 g in sodium chloride 0.9 % 50 mL IVPB  Status:  Discontinued     1 g 100 mL/hr over 30 Minutes Intravenous Every 24 hours 08/11/12 1738 08/11/12 2005   08/11/12 0600  ertapenem (INVANZ) 1 g in sodium chloride 0.9 % 50 mL IVPB     1 g 100 mL/hr over 30 Minutes Intravenous On  call to O.R. 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   LOS: 3 days    Robert Hoover K. 08/14/2012

## 2012-08-15 MED ORDER — SUCRALFATE 1 G PO TABS
1.0000 g | ORAL_TABLET | Freq: Three times a day (TID) | ORAL | Status: DC
  Administered 2012-08-15 – 2012-08-16 (×4): 1 g via ORAL
  Filled 2012-08-15 (×8): qty 1

## 2012-08-15 MED ORDER — PANTOPRAZOLE SODIUM 40 MG IV SOLR
40.0000 mg | Freq: Once | INTRAVENOUS | Status: AC
  Administered 2012-08-15: 40 mg via INTRAVENOUS
  Filled 2012-08-15: qty 40

## 2012-08-15 NOTE — Progress Notes (Signed)
4 Days Post-Op  Subjective: Nausea much improved. Several more bowel movements Pain moderate - controlled with Percocet  Objective: Vital signs in last 24 hours: Temp:  [98.3 F (36.8 C)-99.1 F (37.3 C)] 99.1 F (37.3 C) (06/14 0511) Pulse Rate:  [97-120] 97 (06/14 0511) Resp:  [18-20] 18 (06/14 0511) BP: (125-128)/(81-87) 128/81 mmHg (06/14 0511) SpO2:  [97 %-99 %] 97 % (06/14 0511) Last BM Date: 08/13/12  Intake/Output from previous day: 06/13 0701 - 06/14 0700 In: 901 [P.O.:240; I.V.:661] Out: 750 [Urine:350; Emesis/NG output:400] Intake/Output this shift:    General appearance: alert, cooperative and no distress Resp: clear to auscultation bilaterally Cardio: regular rate and rhythm, S1, S2 normal, no murmur, click, rub or gallop GI: less distended; good bowel sounds Incisions c/d/i  Lab Results:   Recent Labs  08/13/12 0450 08/14/12 0535  WBC 7.3 7.8  HGB 7.4* 8.0*  HCT 25.2* 27.3*  PLT 258 258   BMET  Recent Labs  08/13/12 0450 08/14/12 0535  NA 138 135  K 4.0 4.2  CL 104 101  CO2 30 28  GLUCOSE 94 102*  BUN 9 15  CREATININE 0.93 0.86  CALCIUM 8.1* 8.7   PT/INR No results found for this basename: LABPROT, INR,  in the last 72 hours ABG No results found for this basename: PHART, PCO2, PO2, HCO3,  in the last 72 hours  Studies/Results: No results found.  Anti-infectives: Anti-infectives   Start     Dose/Rate Route Frequency Ordered Stop   08/12/12 1300  ertapenem (INVANZ) 1 g in sodium chloride 0.9 % 50 mL IVPB     1 g 100 mL/hr over 30 Minutes Intravenous  Once 08/11/12 2005 08/12/12 1325   08/11/12 1830  ertapenem (INVANZ) 1 g in sodium chloride 0.9 % 50 mL IVPB  Status:  Discontinued     1 g 100 mL/hr over 30 Minutes Intravenous Every 24 hours 08/11/12 1738 08/11/12 2005   08/11/12 0600  ertapenem (INVANZ) 1 g in sodium chloride 0.9 % 50 mL IVPB     1 g 100 mL/hr over 30 Minutes Intravenous On call to O.R. 08/10/12 1410 08/11/12 1242       Assessment/Plan: s/p Procedure(s): LAPAROSCOPIC ASSISTED RIGHT HEMICOLECTOMY (Right) Advance diet Plan for discharge tomorrow Add Carafate for heartburn.  Continue Protonix   LOS: 4 days    Holland Kotter K. 08/15/2012

## 2012-08-16 MED ORDER — PANTOPRAZOLE SODIUM 20 MG PO TBEC: 20.0000 mg | DELAYED_RELEASE_TABLET | Freq: Every day | ORAL | Status: DC

## 2012-08-16 MED ORDER — OXYCODONE-ACETAMINOPHEN 5-325 MG PO TABS: 1.0000 | ORAL_TABLET | ORAL | Status: DC | PRN

## 2012-08-16 MED ORDER — SUCRALFATE 1 G PO TABS: 1.0000 g | ORAL_TABLET | Freq: Three times a day (TID) | ORAL | Status: DC

## 2012-08-16 NOTE — Progress Notes (Signed)
Pt for discharge to home accomp by wife.  Rx given and explained.  FU appts reviewed.  Pt/wife understand how to call for any problems or difficulties.

## 2012-08-16 NOTE — Discharge Summary (Signed)
Physician Discharge Summary  Patient ID: Robert Hoover MRN: [id number] DOB/AGE: 10-07-1955 57 y.o.  Admit date: 08/11/2012 Discharge date: 08/16/2012  Admission Diagnoses:  Villous adenoma - cecum  Discharge Diagnoses: Adenocarcinoma of the cecum - T1bN0 Active Problems:   * No active hospital problems. *   Discharged Condition: good  Hospital Course: He underwent laparoscopic-assisted right hemicolectomy on 08/11/12.  Post-op, he was kept on Entereg.  He began having flatus and bowel movements on POD #3.  He had significant heartburn and nausea, and was started on Protonix and Carafate.  This seemed to relieve his symptoms.  He continued to have bowel movements and was advanced to a soft diet.  His pain is well-managed with PRN Percocet.    Consults: None  Significant Diagnostic Studies: Path - invasive adenocarcinoma 0/22 LN - T1bN0  Treatments: surgery: as above  Discharge Exam: Blood pressure 116/61, pulse 70, temperature 99 F (37.2 C), temperature source Oral, resp. rate 18, height 5\' 5"  (1.651 m), weight 169 lb (76.658 kg), SpO2 95.00%. General appearance: alert, cooperative and no distress Resp: clear to auscultation bilaterally Cardio: regular rate and rhythm, S1, S2 normal, no murmur, click, rub or gallop GI: much less distended; active bowel sounds Incisions c/d/i  Disposition:   Discharge Orders   Future Orders Complete By Expires     Call MD for:  persistant nausea and vomiting  As directed     Call MD for:  redness, tenderness, or signs of infection (pain, swelling, redness, odor or green/yellow discharge around incision site)  As directed     Call MD for:  severe uncontrolled pain  As directed     Call MD for:  temperature >100.4  As directed     Diet general  As directed     Driving Restrictions  As directed     Comments:      Do not drive while taking pain medications    Increase activity slowly  As directed     May shower / Bathe  As directed     May walk  up steps  As directed         Medication List    TAKE these medications       ciprofloxacin 500 MG tablet  Commonly known as:  CIPRO  Take 500 mg by mouth 2 (two) times daily.     levothyroxine 100 MCG tablet  Commonly known as:  SYNTHROID, LEVOTHROID  Take 100 mcg by mouth daily before breakfast.     oxyCODONE-acetaminophen 5-325 MG per tablet  Commonly known as:  PERCOCET/ROXICET  Take 1 tablet by mouth every 4 (four) hours as needed.     pantoprazole 20 MG tablet  Commonly known as:  PROTONIX  Take 1 tablet (20 mg total) by mouth daily.     sucralfate 1 G tablet  Commonly known as:  CARAFATE  Take 1 tablet (1 g total) by mouth 4 (four) times daily -  with meals and at bedtime.           Follow-up Information   Follow up with Wynona Luna., MD. Schedule an appointment as soon as possible for a visit in 2 weeks.   Contact information:   [address] [place] [address] [phone]     My office will schedule you for an Oncology referral at the [facility].   Signed: Abdirahman Chittum K. 08/16/2012, 6:45 AM

## 2012-08-17 ENCOUNTER — Other Ambulatory Visit (INDEPENDENT_AMBULATORY_CARE_PROVIDER_SITE_OTHER): Payer: Self-pay | Admitting: Surgery

## 2012-08-17 DIAGNOSIS — C189 Malignant neoplasm of colon, unspecified: Secondary | ICD-10-CM

## 2012-08-24 ENCOUNTER — Telehealth: Payer: Self-pay | Admitting: Oncology

## 2012-08-24 NOTE — Telephone Encounter (Signed)
C/D 08/24/12 for appt. 09/15/12 °

## 2012-08-24 NOTE — Telephone Encounter (Signed)
C/D 08/24/12 for appt. 09/15/12

## 2012-09-07 ENCOUNTER — Encounter (INDEPENDENT_AMBULATORY_CARE_PROVIDER_SITE_OTHER): Payer: Self-pay | Admitting: Surgery

## 2012-09-07 ENCOUNTER — Ambulatory Visit (INDEPENDENT_AMBULATORY_CARE_PROVIDER_SITE_OTHER): Payer: BC Managed Care – PPO | Admitting: Surgery

## 2012-09-07 VITALS — BP 118/68 | HR 78 | Temp 98.0°F | Resp 16 | Ht 65.0 in | Wt 166.4 lb

## 2012-09-07 DIAGNOSIS — C182 Malignant neoplasm of ascending colon: Secondary | ICD-10-CM

## 2012-09-07 NOTE — Progress Notes (Signed)
Status post right hemicolectomy on 08/11/12 for right colon mass. This turned out to be adenocarcinoma. His tumor is T1b N0. He had 0/22 lymph nodes positive. He was discharged home a couple of weeks ago and has been doing quite well.  Appetite is good. He had a lot of problems with heartburn and reflux in the hospital we have started him on Protonix as well as Carafate. Currently he is only taking the Carafate. He denies any abdominal pain. His main complaint is itching and rash that began in the hospital.  His abdomen is soft nontender. Incisions are well-healed with no sign of infection or hernia. He has a patchy pink rash over his upper chest and both lower extremities.  Impression: Adenocarcinoma of the right colon status post laparoscopic right hemicolectomy    intermittent patchy rash  Continue limiting activity until next visit. He has an oncology appointment with Dr. Truett Perna next week. He should try to get himself off of the Carafate to see if this is causing the rash. He may use Benadryl as needed for itching. He will let us know sooner if the rash does not resolve after stopping the Carafate. We may need to try a short dose of steroids.  Follow-up 3 weeks.  Wilmon Arms. Corliss Skains, MD, [facility] Surgery  General/ Trauma Surgery  09/07/2012 2:40 PM

## 2012-09-07 NOTE — Patient Instructions (Signed)
Stop taking the Protonix Try to get off of the Carafate  Benadryl (over the counter) 25 mg PO every 6 hours as needed for itching  Call us back at [phone] if you are continuing to experience rash/ itching after being off of the Carafate for a few days.

## 2012-09-08 ENCOUNTER — Telehealth: Payer: Self-pay | Admitting: *Deleted

## 2012-09-08 NOTE — Telephone Encounter (Signed)
Spoke with patient by phone and confirmed appointment with Dr. Truett Perna for 09/15/12.

## 2012-09-15 ENCOUNTER — Ambulatory Visit (HOSPITAL_BASED_OUTPATIENT_CLINIC_OR_DEPARTMENT_OTHER): Payer: BC Managed Care – PPO | Admitting: Oncology

## 2012-09-15 ENCOUNTER — Encounter: Payer: Self-pay | Admitting: Oncology

## 2012-09-15 ENCOUNTER — Ambulatory Visit: Payer: BC Managed Care – PPO

## 2012-09-15 VITALS — BP 97/66 | HR 78 | Temp 98.4°F | Resp 20 | Ht 65.0 in | Wt 166.5 lb

## 2012-09-15 DIAGNOSIS — C182 Malignant neoplasm of ascending colon: Secondary | ICD-10-CM

## 2012-09-15 NOTE — Progress Notes (Signed)
Patient said he has lots of bills. I gave him ph# for billing to see if he can get application for asst. He said the bills are over 5000.00. He will fax a copy of his last 3 paystubs to see if he can possibly get 400.00 grant to help with some bills. He has a lot of bills and he said he needs help. I confirmed address and ph#,

## 2012-09-15 NOTE — Progress Notes (Signed)
[facility] New Patient Consult   Referring MD: Banyan Goodchild 57 y.o.  March 13, 1955    Reason for Referral: Colon cancer     HPI: He was noted to have a microcytic anemia when he was seen by his primary physician. A CBC 07/01/2012, hemoglobin is 7.9 with an MCV of 60.5. He has a history of a large colon polyp on a colonoscopy in 2010. He declined a surgical referral.  He was referred to Dr.Rhoton and was taken to a colonoscopy procedure on 07/10/2012. At the proximal a sending colon extending to the level of the ileocecal valve a large fungating mass was noted. Biopsies were obtained. The area was marked with ink. A sessile polyp was removed at the hepatic flexure. A sessile polyp was removed in the sigmoid colon. An upper endoscopy found a normal a small hiatal hernia was noted. The stomach was otherwise normal. The duodenum was normal. The pathology confirmed she will her adenomas at the hepatic flexure and sigmoid colon. Biopsy from the colon mass revealed a villous adenoma with focal high-grade dysplasia. No evidence of stromal invasion.  He was referred to Dr.Tsuei. He was transfused with packed of blood cells and was taken the operating room on 08/11/2012 for a laparoscopic-assisted right hemicolectomy. The tumor was palpated at the right colon. The transverse colon and distal ileum were divided and a ileocolic anastomosis was created.  The pathology ([id number]) revealed an invasive well-differentiated adenocarcinoma. Adenocarcinoma extended into the submucosa. The surgical resection margins were negative. No evidence of carcinoma was noted in 22 lymph nodes. There was no macroscopic tumor perforation. Tumor arose in a tubulovillous adenoma. Lymphovascular and perineural invasion were not identified. The tumor returned microsatellite stable. No loss of mismatch repair protein expression.  He is referred for oncology evaluation.    Past Medical History    Diagnosis Date  . Hyperlipidemia   . Anemia-microcytic   April 2014   . Asthma     Hx: of [place] induced due to Pollen  . Seasonal allergies     Hx: of  . Hyperthyroidism      Hx: of with radiation treatment  . 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   Colon polyps-tubulovillous adenomas                                                              12/30/2008  Past Surgical History  Procedure Laterality Date  . Colonoscopy w/ biopsies and polypectomy   12/30/2008    Hx: of  . Hemicolectomy Right 08/11/2012  . Tonsillectomy  1966  . Cholecystectomy  2010  . Laparoscopic partial colectomy Right 08/11/2012    Procedure: LAPAROSCOPIC ASSISTED RIGHT HEMICOLECTOMY;  Surgeon: Wilmon Arms. Corliss Skains, MD;  Location: [facility] OR;  Service: General;  Laterality: Right;    Family History  Problem Relation Age of Onset  . Kidney cancer Father   . Hypertension Mother   . Hypothyroidism Sister    one sister, 2 children. 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 He quit smoking cigarettes in drinking alcohol approximately 25 years ago. He was transfused prior to surgery in June of 2014. No risk factor for HIV or hepatitis.   ROS:   Positives include: Decreased stool caliber prior to surgery, "rash "while in the hospital-improving  A complete ROS was otherwise negative.  Physical Exam:  Blood pressure 97/66, pulse 78, temperature 98.4 F (36.9 C), temperature source Oral, resp. rate 20, height 5\' 5"  (1.651 m), weight 166 lb 8 oz (75.524 kg).  HEENT: Multiple missing teeth, oropharynx without visible mass, neck without mass Lungs: Clear bilaterally Cardiac: Regular rate and rhythm Abdomen: Healed surgical incisions, no  hepatomegaly, no mass GU: Testes without mass  Vascular: No leg edema Lymph nodes: No cervical, supraclavicular, axillary, or inguinal nodes Neurologic: Alert and oriented, the motor exam appears intact in the upper and lower extremities Skin: rash that appears to be fading at the axillae and legs 2 cm round lesion at the upper back that appears to be a sebaceous cyst Musculoskeletal: No spine tenderness  LAB:  CBC  Lab Results  Component Value Date   WBC 7.8 08/14/2012   HGB 8.0* 08/14/2012   HCT 27.3* 08/14/2012   MCV 66.6* 08/14/2012   PLT 258 08/14/2012     CMP      Component Value Date/Time   NA 135 08/14/2012 0535   K 4.2 08/14/2012 0535   CL 101 08/14/2012 0535   CO2 28 08/14/2012 0535   GLUCOSE 102* 08/14/2012 0535   BUN 15 08/14/2012 0535   CREATININE 0.86 08/14/2012 0535   CALCIUM 8.7 08/14/2012 0535   PROT 7.0 08/05/2012 1019   ALBUMIN 3.5 08/05/2012 1019   AST 20 08/05/2012 1019   ALT 20 08/05/2012 1019   ALKPHOS 54 08/05/2012 1019   BILITOT 0.6 08/05/2012 1019   GFRNONAA >90 08/14/2012 0535   GFRAA >90 08/14/2012 0535     Radiology: Chest x-ray 08/05/2012-no acute cardiopulmonary abnormality    Assessment/Plan:   1. Stage I (pT1,pN0) well-differentiated adenocarcinoma of the cecum, status post a right hemicolectomy on 08/11/2012. The tumor was microsatellite stable with no loss of mismatch repair protein expression  2. History of colon polyps, tubulovillous adenomas noted on a colonoscopy in 2010  3. Microcytic anemia-status post a red cell transfusion 08/11/2012, I recommended he begin ferrous sulfate and followup with his primary physician for a CBC within the next one month  4. Report of a mildly elevated PSA-followup with primary physician/urology    Disposition:   He has been diagnosed with stage I colon cancer. I discussed the diagnosis and prognosis with Robert Hoover and his wife. He has a good prognosis for a long-term disease-free survival. There is no  indication for adjuvant chemotherapy.  We discussed diet and exercise as a strategies to decrease the risk of colon cancer. He should continue surveillance colonoscopies with Dr.Rhoton with the next colonoscopy scheduled for approximately one year.  I recommended he begin ferrous sulfate and followup with his primary physician for a CBC within the next one month.  He will alert family members of his diagnosis so they can obtain appropriate screening. He does not appear to have hereditary non-polyposis colon cancer.   He is not scheduled for a followup appointment at the [facility]. We will be glad to see him in the future as needed.  Robert Hoover 09/15/2012, 5:40 PM

## 2012-09-15 NOTE — Progress Notes (Signed)
Presents to office for new patient visit with his wife, [place]. Completed the Patient Measure of Distress tool and rates his stress 2/10. Stress is related to paying so many medical bills. He will be directed to the financial counselor on his way out to help alleviate this concern. Made him aware that hospital has assistance for those who qualify and will also may payment plans for him based on his income. Reports he has returned to work full time-just on lighter duty at present time.

## 2012-09-17 ENCOUNTER — Encounter: Payer: Self-pay | Admitting: Oncology

## 2012-09-17 NOTE — Progress Notes (Signed)
Patient had faxed copies of his pay stubs for possible asst. I called and left him a message. He has to be an active patient for the grant. He is not. He can still call billing for possible asst with existing bills.

## 2012-10-02 ENCOUNTER — Encounter (INDEPENDENT_AMBULATORY_CARE_PROVIDER_SITE_OTHER): Payer: Self-pay | Admitting: Surgery

## 2012-10-02 ENCOUNTER — Ambulatory Visit (INDEPENDENT_AMBULATORY_CARE_PROVIDER_SITE_OTHER): Payer: BC Managed Care – PPO | Admitting: Surgery

## 2012-10-02 VITALS — BP 110/72 | HR 76 | Temp 99.7°F | Resp 16 | Ht 66.0 in | Wt 166.6 lb

## 2012-10-02 DIAGNOSIS — C182 Malignant neoplasm of ascending colon: Secondary | ICD-10-CM

## 2012-10-02 NOTE — Progress Notes (Signed)
The patient returns for a final postoperative visit after a laparoscopic right hemicolectomy on 08/11/12. He had an consultation with oncology. There is no indication for any further treatment at this time. His appetite and bowel movements have returned to normal. He is back to work but is not doing any heavy lifting at this time. His incisions are all well-healed with no sign of infection. No sign of hernia and a small midline incision. He may resume full activity. Followup as needed. Continue with surveillance colonoscopies per GI.  Wilmon Arms. Corliss Skains, MD, [facility] Surgery  General/ Trauma Surgery  10/02/2012 12:56 PM

## 2013-02-05 ENCOUNTER — Encounter: Payer: Self-pay | Admitting: Oncology

## 2013-02-05 NOTE — Progress Notes (Signed)
Patient called and wanted funds. I advised him not active with [place] so no asst is available.

## 2014-06-22 ENCOUNTER — Encounter: Payer: Self-pay | Admitting: Internal Medicine

## 2014-08-10 ENCOUNTER — Ambulatory Visit (AMBULATORY_SURGERY_CENTER): Payer: Self-pay

## 2014-08-10 VITALS — Ht 65.25 in | Wt 177.0 lb

## 2014-08-10 DIAGNOSIS — Z85038 Personal history of other malignant neoplasm of large intestine: Secondary | ICD-10-CM

## 2014-08-10 MED ORDER — SUPREP BOWEL PREP KIT 17.5-3.13-1.6 GM/177ML PO SOLN: 1.0000 | Freq: Once | ORAL | Status: DC

## 2014-08-10 NOTE — Progress Notes (Signed)
No allergies to eggs or soy No home oxygen No diet/weight loss meds No past problems with anesthesia  Has email  Emmi instructions given for colonoscopy 

## 2014-08-24 ENCOUNTER — Encounter: Payer: Self-pay | Admitting: Internal Medicine

## 2014-09-02 ENCOUNTER — Ambulatory Visit (AMBULATORY_SURGERY_CENTER): Payer: 59 | Admitting: Internal Medicine

## 2014-09-02 ENCOUNTER — Encounter: Payer: Self-pay | Admitting: Internal Medicine

## 2014-09-02 VITALS — BP 128/77 | HR 68 | Temp 98.2°F | Resp 14 | Ht 65.25 in | Wt 177.0 lb

## 2014-09-02 DIAGNOSIS — D122 Benign neoplasm of ascending colon: Secondary | ICD-10-CM

## 2014-09-02 DIAGNOSIS — Z85038 Personal history of other malignant neoplasm of large intestine: Secondary | ICD-10-CM | POA: Diagnosis not present

## 2014-09-02 DIAGNOSIS — Z1211 Encounter for screening for malignant neoplasm of colon: Secondary | ICD-10-CM

## 2014-09-02 DIAGNOSIS — D126 Benign neoplasm of colon, unspecified: Secondary | ICD-10-CM

## 2014-09-02 DIAGNOSIS — D125 Benign neoplasm of sigmoid colon: Secondary | ICD-10-CM | POA: Diagnosis not present

## 2014-09-02 MED ORDER — SODIUM CHLORIDE 0.9 % IV SOLN: 500.0000 mL | INTRAVENOUS | Status: DC

## 2014-09-02 NOTE — Op Note (Signed)
[place]  [address] [place], [address]   COLONOSCOPY PROCEDURE REPORT  PATIENT: Robert Hoover, Robert Hoover  MR#: [id number] BIRTHDATE: 21-Jun-1955 , 36  yrs. old GENDER: male ENDOSCOPIST: Lafayette Dragon, MD REFERRED OV:ZCHYIFO Aguiar, M.D. PROCEDURE DATE:  09/02/2014 PROCEDURE:   Colonoscopy, surveillance and Colonoscopy with cold biopsy polypectomy First Screening Colonoscopy - Avg.  risk and is 50 yrs.  old or older - No.  Prior Negative Screening - Now for repeat screening. N/A  History of Adenoma - Now for follow-up colonoscopy & has been > or = to 3 yrs.  Yes hx of adenoma.  Has been 3 or more years since last colonoscopy.  Polyps removed today? Yes ASA CLASS:   Class II INDICATIONS:Surveillance due to prior colonic neoplasia and PH Colon or Rectal Adenocarcinoma. MEDICATIONS: Monitored anesthesia care and Propofol 200 mg IV  DESCRIPTION OF PROCEDURE:   After the risks benefits and alternatives of the procedure were thoroughly explained, informed consent was obtained.  The digital rectal exam revealed no abnormalities of the rectum.   The [id number]  endoscope was introduced through the anus and advanced to the surgical anastomosis. No adverse events experienced.   The quality of the prep was good.  (MoviPrep was used)  The instrument was then slowly withdrawn as the colon was fully examined. Estimated blood loss is zero unless otherwise noted in this procedure report.      COLON FINDINGS: There was evidence of a prior end-to-end ileocolonic surgical anastomosis in the ascending colon. anastomosis was widely open. There was no evidence of recurrent cancer  Two sessile polyps measuring 4 mm in size were found in the ascending colon and sigmoid colon.  A polypectomy was performed with cold forceps.  The resection was complete, the polyp tissue was completely retrieved and sent to histology.  Retroflexed views revealed no abnormalities. The time to  cecum = 2.40 Withdrawal time = 9.25 The scope was withdrawn and the procedure completed. COMPLICATIONS: There were no immediate complications.  ENDOSCOPIC IMPRESSION: 1.   There was evidence of a prior ileocolonic surgical anastomosis in the ascending colon 2.   Two sessile polyps were found in the ascending colon and sigmoid colon; polypectomy was performed with cold forceps 3.  history of cecal carcinoma. Status post a right hemicolectomy. No evidence of recurrent cancer   RECOMMENDATIONS: 1.  Await pathology results 2.  Recall colonoscopy in 2 years Follow CEA level and CBC  eSigned:  Lafayette Dragon, MD 09/02/2014 9:09 AM   cc:   PATIENT NAME:  Robert Hoover, Robert Hoover MR#: [id number]

## 2014-09-02 NOTE — Patient Instructions (Signed)
Impressions/recommendations:  Polyps (handout given)  Recall colonoscopy in 2 years.  YOU HAD AN ENDOSCOPIC PROCEDURE TODAY AT [facility]:   Refer to the procedure report that was given to you for any specific questions about what was found during the examination.  If the procedure report does not answer your questions, please call your gastroenterologist to clarify.  If you requested that your care partner not be given the details of your procedure findings, then the procedure report has been included in a sealed envelope for you to review at your convenience later.  YOU SHOULD EXPECT: Some feelings of bloating in the abdomen. Passage of more gas than usual.  Walking can help get rid of the air that was put into your GI tract during the procedure and reduce the bloating. If you had a lower endoscopy (such as a colonoscopy or flexible sigmoidoscopy) you may notice spotting of blood in your stool or on the toilet paper. If you underwent a bowel prep for your procedure, you may not have a normal bowel movement for a few days.  Please Note:  You might notice some irritation and congestion in your nose or some drainage.  This is from the oxygen used during your procedure.  There is no need for concern and it should clear up in a day or so.  SYMPTOMS TO REPORT IMMEDIATELY:   Following lower endoscopy (colonoscopy or flexible sigmoidoscopy):  Excessive amounts of blood in the stool  Significant tenderness or worsening of abdominal pains  Swelling of the abdomen that is new, acute  Fever of 100F or higher   For urgent or emergent issues, a gastroenterologist can be reached at any hour by calling [phone].   DIET: Your first meal following the procedure should be a small meal and then it is ok to progress to your normal diet. Heavy or fried foods are harder to digest and may make you feel nauseous or bloated.  Likewise, meals heavy in dairy and vegetables can increase  bloating.  Drink plenty of fluids but you should avoid alcoholic beverages for 24 hours.  ACTIVITY:  You should plan to take it easy for the rest of today and you should NOT DRIVE or use heavy machinery until tomorrow (because of the sedation medicines used during the test).    FOLLOW UP: Our staff will call the number listed on your records the next business day following your procedure to check on you and address any questions or concerns that you may have regarding the information given to you following your procedure. If we do not reach you, we will leave a message.  However, if you are feeling well and you are not experiencing any problems, there is no need to return our call.  We will assume that you have returned to your regular daily activities without incident.  If any biopsies were taken you will be contacted by phone or by letter within the next 1-3 weeks.  Please call us at [phone] if you have not heard about the biopsies in 3 weeks.    SIGNATURES/CONFIDENTIALITY: You and/or your care partner have signed paperwork which will be entered into your electronic medical record.  These signatures attest to the fact that that the information above on your After Visit Summary has been reviewed and is understood.  Full responsibility of the confidentiality of this discharge information lies with you and/or your care-partner.

## 2014-09-02 NOTE — Progress Notes (Signed)
Transferred to recovery room. A/O x3, pleased with MAC.  VSS.  Report to Olivia Mackie, Therapist, sports.

## 2014-09-02 NOTE — Progress Notes (Signed)
Called to room to assist during endoscopic procedure.  Patient ID and intended procedure confirmed with present staff. Received instructions for my participation in the procedure from the performing physician.  

## 2014-09-06 ENCOUNTER — Telehealth: Payer: Self-pay

## 2014-09-06 NOTE — Telephone Encounter (Signed)
No answer, left voicemail

## 2014-09-08 ENCOUNTER — Encounter: Payer: Self-pay | Admitting: Internal Medicine

## 2014-09-15 ENCOUNTER — Encounter: Payer: Self-pay | Admitting: Internal Medicine

## 2014-09-21 ENCOUNTER — Telehealth: Payer: Self-pay | Admitting: Internal Medicine

## 2014-09-21 NOTE — Telephone Encounter (Signed)
Spoke with patient and gave him the results as per letter that was mailed on 09/13/14. Mailed patient another copy of letter.

## 2016-07-08 ENCOUNTER — Encounter: Payer: Self-pay | Admitting: Gastroenterology

## 2017-05-30 ENCOUNTER — Ambulatory Visit (HOSPITAL_COMMUNITY)
Admission: EM | Admit: 2017-05-30 | Discharge: 2017-05-30 | Disposition: A | Discharge status: Home / Self Care | Payer: BLUE CROSS/BLUE SHIELD | Attending: Family Medicine | Admitting: Family Medicine

## 2017-05-30 ENCOUNTER — Encounter (HOSPITAL_COMMUNITY): Payer: Self-pay | Admitting: Emergency Medicine

## 2017-05-30 DIAGNOSIS — L089 Local infection of the skin and subcutaneous tissue, unspecified: Secondary | ICD-10-CM

## 2017-05-30 DIAGNOSIS — L723 Sebaceous cyst: Secondary | ICD-10-CM

## 2017-05-30 MED ORDER — CEPHALEXIN 500 MG PO CAPS: 500.0000 mg | ORAL_CAPSULE | Freq: Four times a day (QID) | ORAL | 0 refills | Status: AC

## 2017-05-30 MED ORDER — BACITRACIN ZINC 500 UNIT/GM EX OINT
TOPICAL_OINTMENT | CUTANEOUS | Status: AC
  Filled 2017-05-30: qty 9

## 2017-05-30 NOTE — Discharge Instructions (Signed)
Infected cyst drained today.  Start Keflex as directed. Daily wound cleaning with soap and water. Keep area clean and dry. You can dress it for work as it can continue to drain.  As discussed, this can return on its own. You can follow-up with PCP, surgery, dermatology for removal of the cyst wall.  Monitor for any spreading redness, increased warmth, increased swelling, fever, follow-up for reevaluation.

## 2017-05-30 NOTE — ED Triage Notes (Signed)
Pt c/o pimple on his back, states he is unable to reach it on his back. Large abscess on center mid back.

## 2017-05-30 NOTE — ED Provider Notes (Signed)
[place]    CSN: [id number] Arrival date & time: 05/30/17  0957     History   Chief Complaint Chief Complaint  Patient presents with  . Abscess    HPI Robert Hoover is a 62 y.o. male.   62 year old male comes in for 1-2 week worsening of pain to the mid back with known cyst. States that in the past few years, his wife would periodically squeeze out the contents and it would feel better. Wife was diagnosed with ALS and has been unable to do that for the past 2 years. Recently started feeling more pressure and tightness, and now with pain. No obvious erythema or increased warmth. Denies fever, chills, night sweats.      Past Medical History:  Diagnosis Date  . Anemia   . Asthma    Hx: of [place] induced due to [place]  . Hyperlipidemia   . Hyperthyroidism     Hx: of with radiation treatment  . 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 Seasonal allergies    Hx: of    Patient Active Problem List   Diagnosis Date Noted  . Cancer of right colon - T1b, N0 09/07/2012  . BENIGN NEOPLASM OF COLON 01/03/2009  . GASTRIC ULCER, HX OF 12/30/2008  . BILIARY COLIC 81/19/1478  . LIVER FUNCTION TESTS, ABNORMAL, HX OF 05/16/2008    Past Surgical History:  Procedure Laterality Date  . CHOLECYSTECTOMY  2010  . COLONOSCOPY W/ BIOPSIES AND POLYPECTOMY     Hx: of  . HEMICOLECTOMY Right 08/11/2012  . LAPAROSCOPIC PARTIAL COLECTOMY Right 08/11/2012   Procedure: LAPAROSCOPIC ASSISTED RIGHT HEMICOLECTOMY;  Surgeon: Imogene Burn. Georgette Dover, MD;  Location: [facility];  Service: General;  Laterality: Right;  . TONSILLECTOMY  1966       Home Medications    Prior to Admission medications   Medication Sig Start Date End Date Taking? Authorizing Provider  cephALEXin (KEFLEX) 500 MG capsule Take 1 capsule (500 mg total) by mouth 4 (four) times daily for 5 days. 05/30/17 06/04/17  Ok Edwards, PA-C  levothyroxine (SYNTHROID, LEVOTHROID) 100 MCG tablet Take 100 mcg by  mouth daily before breakfast.    [provider]  Multiple Vitamin (MULTIVITAMIN) tablet Take 1 tablet by mouth daily. Men's 50+    [provider]    Family History Family History  Problem Relation Age of Onset  . Kidney cancer Father   . Hypertension Mother   . Hypothyroidism Sister   . Colon cancer Neg Hx     Social History Social History   Tobacco Use  . Smoking status: Former Smoker    Start date: 07/24/1982    Last attempt to quit: 07/24/1982    Years since quitting: 34.8  . Smokeless tobacco: Never Used  Substance Use Topics  . Alcohol use: Yes    Comment: 08/12/2012 "quit drinking in the 1980's"  . Drug use: No     Allergies   Other   Review of Systems Review of Systems  Reason unable to perform ROS: See HPI as above.     Physical Exam Triage Vital Signs ED Triage Vitals [05/30/17 1017]  Enc Vitals Group     BP 109/67     Pulse Rate 72     Resp 18     Temp 98 F (36.7 C)     Temp src      SpO2 98 %     Weight  Height      Head Circumference      Peak Flow      Pain Score      Pain Loc      Pain Edu?      Excl. in [place]?    No data found.  Updated Vital Signs BP 109/67   Pulse 72   Temp 98 F (36.7 C)   Resp 18   SpO2 98%   Physical Exam  Constitutional: He is oriented to person, place, and time. He appears well-developed and well-nourished. No distress.  HENT:  Head: Normocephalic and atraumatic.  Eyes: Pupils are equal, round, and reactive to light. Conjunctivae are normal.  Neurological: He is alert and oriented to person, place, and time.  Skin: Skin is warm and dry.  3cm x 3cm fluctuance felt. About 1cm round erythema to the left lower side of cyst. No increased warmth.      UC Treatments / Results  Labs (all labs ordered are listed, but only abnormal results are displayed) Labs Reviewed - No data to display  EKG None Radiology No results found.  Procedures Incision and Drainage Date/Time:  05/30/2017 11:33 AM Performed by: Ok Edwards, PA-C Authorized by: Wardell Honour, MD   Consent:    Consent obtained:  Verbal   Consent given by:  Patient   Risks discussed:  Bleeding, incomplete drainage, infection and pain   Alternatives discussed:  Referral and alternative treatment Location:    Type:  Abscess   Size:  3cm x 3cm   Location:  Trunk   Trunk location:  Back Pre-procedure details:    Skin preparation:  Betadine Anesthesia (see MAR for exact dosages):    Anesthesia method:  Local infiltration   Local anesthetic:  Lidocaine 2% WITH epi Procedure type:    Complexity:  Simple Procedure details:    Incision types:  Single straight   Incision depth:  Dermal   Scalpel blade:  11   Wound management:  Probed and deloculated   Drainage:  Bloody and purulent   Drainage amount:  Copious   Wound treatment:  Wound left open   Packing materials:  None Post-procedure details:    Patient tolerance of procedure:  Tolerated well, no immediate complications   (including critical care time)  Medications Ordered in UC Medications - No data to display   Initial Impression / Assessment and Plan / UC Course  I have reviewed the triage vital signs and the nursing notes.  Pertinent labs & imaging results that were available during my care of the patient were reviewed by me and considered in my medical decision making (see chart for details).    Patient tolerated I&D well. Infected sebaceous cyst, without obvious cyst wall. Keflex as directed. Wound care instructions given. Discussed with patient cyst may return, can follow up with PCP, general surgery, dermatology for cyst removal. Return precautions given.   Final Clinical Impressions(s) / UC Diagnoses   Final diagnoses:  Infected sebaceous cyst    ED Discharge Orders        Ordered    cephALEXin (KEFLEX) 500 MG capsule  4 times daily     05/30/17 1123        Ok Edwards, [place] 05/30/17 1136

## 2017-08-21 ENCOUNTER — Encounter: Payer: Self-pay | Admitting: Gastroenterology

## 2017-10-14 ENCOUNTER — Encounter: Payer: Self-pay | Admitting: Gastroenterology

## 2017-12-12 ENCOUNTER — Ambulatory Visit (AMBULATORY_SURGERY_CENTER): Payer: Self-pay | Admitting: *Deleted

## 2017-12-12 ENCOUNTER — Encounter: Payer: Self-pay | Admitting: Gastroenterology

## 2017-12-12 VITALS — Ht 64.0 in | Wt 184.6 lb

## 2017-12-12 DIAGNOSIS — Z85038 Personal history of other malignant neoplasm of large intestine: Secondary | ICD-10-CM

## 2017-12-12 DIAGNOSIS — Z86010 Personal history of colonic polyps: Secondary | ICD-10-CM

## 2017-12-12 NOTE — Progress Notes (Signed)
No egg or soy allergy known to patient  No issues with past sedation with any surgeries  or procedures, no intubation problems  No diet pills per patient No home 02 use per patient  No blood thinners per patient  Pt denies issues with constipation  No A fib or A flutter  EMMI video sent to pt's e mail  Pt insisted in PV that he have a gatorade prep- he states last colon with Movi Prep he vomited most of the prep and he only wanted a gatorade prep - given per Dr Jillyn Hidden mira/ gatorade prep

## 2017-12-26 ENCOUNTER — Ambulatory Visit (AMBULATORY_SURGERY_CENTER): Payer: BLUE CROSS/BLUE SHIELD | Admitting: Gastroenterology

## 2017-12-26 ENCOUNTER — Encounter: Payer: Self-pay | Admitting: Gastroenterology

## 2017-12-26 VITALS — BP 122/79 | HR 67 | Temp 98.6°F | Resp 18 | Ht 64.0 in | Wt 184.0 lb

## 2017-12-26 DIAGNOSIS — D128 Benign neoplasm of rectum: Secondary | ICD-10-CM

## 2017-12-26 DIAGNOSIS — D125 Benign neoplasm of sigmoid colon: Secondary | ICD-10-CM | POA: Diagnosis not present

## 2017-12-26 DIAGNOSIS — Z85038 Personal history of other malignant neoplasm of large intestine: Secondary | ICD-10-CM | POA: Diagnosis not present

## 2017-12-26 DIAGNOSIS — K635 Polyp of colon: Secondary | ICD-10-CM

## 2017-12-26 MED ORDER — SODIUM CHLORIDE 0.9 % IV SOLN: 500.0000 mL | Freq: Once | INTRAVENOUS | Status: AC

## 2017-12-26 NOTE — Progress Notes (Signed)
To recovcery, report to RN, VSS

## 2017-12-26 NOTE — Progress Notes (Signed)
Called to room to assist during endoscopic procedure.  Patient ID and intended procedure confirmed with present staff. Received instructions for my participation in the procedure from the performing physician.  

## 2017-12-26 NOTE — Progress Notes (Signed)
Pt's states no medical or surgical changes since previsit or office visit. 

## 2017-12-26 NOTE — Op Note (Signed)
[facility] Patient Name: Robert Hoover Procedure Date: 12/26/2017 9:17 AM MRN: [id number] Endoscopist: Mauri Pole , MD Age: 62 Referring MD:  Date of Birth: Oct 19, 1955 Gender: Male Account #: [id number] Procedure:                Colonoscopy Indications:              Surveillance: Personal history of adenomatous                            polyps on last colonoscopy 3 years ago, High risk                            colon cancer surveillance: Personal history of                            colon cancer Medicines:                Monitored Anesthesia Care Procedure:                Pre-Anesthesia Assessment:                           - Prior to the procedure, a History and Physical                            was performed, and patient medications and                            allergies were reviewed. The patient's tolerance of                            previous anesthesia was also reviewed. The risks                            and benefits of the procedure and the sedation                            options and risks were discussed with the patient.                            All questions were answered, and informed consent                            was obtained. Prior Anticoagulants: The patient has                            taken no previous anticoagulant or antiplatelet                            agents. ASA Grade Assessment: II - A patient with                            mild systemic disease. After reviewing the risks  and benefits, the patient was deemed in                            satisfactory condition to undergo the procedure.                           After obtaining informed consent, the colonoscope                            was passed under direct vision. Throughout the                            procedure, the patient's blood pressure, pulse, and                            oxygen saturations were monitored continuously. The                           Model PCF-[id number]) scope was introduced                            through the anus and advanced to the the                            ileocolonic anastomosis. The colonoscopy was                            performed without difficulty. The patient tolerated                            the procedure well. The quality of the bowel                            preparation was excellent. Ileocolonic anastomosis                            and the rectum were photographed. Scope In: 9:29:27 AM Scope Out: 9:41:45 AM Scope Withdrawal Time: 0 hours 10 minutes 4 seconds  Total Procedure Duration: 0 hours 12 minutes 18 seconds  Findings:                 A 15 mm polyp was found in the sigmoid colon. The                            polyp was pedunculated. The polyp was removed with                            a hot snare. Resection and retrieval were complete.                           A 9 mm polyp was found in the rectum. The polyp was                            semi-pedunculated. The polyp was removed  with a hot                            snare. Resection and retrieval were complete.                           A 1 mm polyp was found in the rectum. The polyp was                            sessile. The polyp was removed with a cold biopsy                            forceps. Resection and retrieval were complete. Complications:            No immediate complications. Estimated Blood Loss:     Estimated blood loss was minimal. Impression:               - One 15 mm polyp in the sigmoid colon, removed                            with a hot snare. Resected and retrieved.                           - One 9 mm polyp in the rectum, removed with a hot                            snare. Resected and retrieved.                           - One 1 mm polyp in the rectum, removed with a cold                            biopsy forceps. Resected and retrieved. Recommendation:           -  Patient has a contact number available for                            emergencies. The signs and symptoms of potential                            delayed complications were discussed with the                            patient. Return to normal activities tomorrow.                            Written discharge instructions were provided to the                            patient.                           - Resume previous diet.                           -  Continue present medications.                           - No aspirin, ibuprofen, naproxen, or other                            non-steroidal anti-inflammatory drugs.                           - Await pathology results.                           - Repeat colonoscopy in 3 years for surveillance                            based on pathology results. Mauri Pole, MD 12/26/2017 9:49:50 AM This report has been signed electronically.

## 2017-12-26 NOTE — Patient Instructions (Signed)
YOU HAD AN ENDOSCOPIC PROCEDURE TODAY AT [facility]:   Refer to the procedure report that was given to you for any specific questions about what was found during the examination.  If the procedure report does not answer your questions, please call your gastroenterologist to clarify.  If you requested that your care partner not be given the details of your procedure findings, then the procedure report has been included in a sealed envelope for you to review at your convenience later.  YOU SHOULD EXPECT: Some feelings of bloating in the abdomen. Passage of more gas than usual.  Walking can help get rid of the air that was put into your GI tract during the procedure and reduce the bloating. If you had a lower endoscopy (such as a colonoscopy or flexible sigmoidoscopy) you may notice spotting of blood in your stool or on the toilet paper. If you underwent a bowel prep for your procedure, you may not have a normal bowel movement for a few days.  Please Note:  You might notice some irritation and congestion in your nose or some drainage.  This is from the oxygen used during your procedure.  There is no need for concern and it should clear up in a day or so.  SYMPTOMS TO REPORT IMMEDIATELY:   Following lower endoscopy (colonoscopy or flexible sigmoidoscopy):  Excessive amounts of blood in the stool  Significant tenderness or worsening of abdominal pains  Swelling of the abdomen that is new, acute  Fever of 100F or higher  For urgent or emergent issues, a gastroenterologist can be reached at any hour by calling [phone].   DIET:  We do recommend a small meal at first, but then you may proceed to your regular diet.  Drink plenty of fluids but you should avoid alcoholic beverages for 24 hours.  ACTIVITY:  You should plan to take it easy for the rest of today and you should NOT DRIVE or use heavy machinery until tomorrow (because of the sedation medicines used during the test).     FOLLOW UP: Our staff will call the number listed on your records the next business day following your procedure to check on you and address any questions or concerns that you may have regarding the information given to you following your procedure. If we do not reach you, we will leave a message.  However, if you are feeling well and you are not experiencing any problems, there is no need to return our call.  We will assume that you have returned to your regular daily activities without incident.  If any biopsies were taken you will be contacted by phone or by letter within the next 1-3 weeks.  Please call us at [phone] if you have not heard about the biopsies in 3 weeks.   Await for biopsy results Polyps (handout given) No ibuprofen, naproxen or other non-steroidal anti-inflammatory drugs for 2 weeks after polyp removal.   SIGNATURES/CONFIDENTIALITY: You and/or your care partner have signed paperwork which will be entered into your electronic medical record.  These signatures attest to the fact that that the information above on your After Visit Summary has been reviewed and is understood.  Full responsibility of the confidentiality of this discharge information lies with you and/or your care-partner.

## 2017-12-29 ENCOUNTER — Telehealth: Payer: Self-pay

## 2017-12-29 NOTE — Telephone Encounter (Signed)
No answer, left message to call back later today, B.Dalisha Shively RN. 

## 2017-12-29 NOTE — Telephone Encounter (Signed)
Called [phone] and left a messaged we tried to reach pt for a follow up call. maw

## 2018-01-08 ENCOUNTER — Encounter: Payer: Self-pay | Admitting: Gastroenterology

## 2021-03-08 ENCOUNTER — Encounter: Payer: Self-pay | Admitting: Gastroenterology
